# Patient Record
Sex: Male | Born: 1955
Health system: Southern US, Community
[De-identification: ages and names within clinical notes are randomized; demographics above are authoritative.]

## PROBLEM LIST (undated history)

## (undated) DIAGNOSIS — K5732 Diverticulitis of large intestine without perforation or abscess without bleeding: Secondary | ICD-10-CM

## (undated) DIAGNOSIS — Z933 Colostomy status: Secondary | ICD-10-CM

## (undated) DIAGNOSIS — M25519 Pain in unspecified shoulder: Secondary | ICD-10-CM

## (undated) DIAGNOSIS — K631 Perforation of intestine (nontraumatic): Secondary | ICD-10-CM

## (undated) DIAGNOSIS — N400 Enlarged prostate without lower urinary tract symptoms: Secondary | ICD-10-CM

## (undated) HISTORY — DX: Benign prostatic hyperplasia without lower urinary tract symptoms: N40.0

## (undated) HISTORY — DX: Pain in unspecified shoulder: M25.519

---

## 2006-01-03 ENCOUNTER — Ambulatory Visit: Payer: Self-pay | Admitting: Gastroenterology

## 2012-03-07 ENCOUNTER — Emergency Department: Payer: Self-pay | Admitting: Unknown Physician Specialty

## 2012-03-07 DIAGNOSIS — Z8781 Personal history of (healed) traumatic fracture: Secondary | ICD-10-CM | POA: Insufficient documentation

## 2012-03-07 HISTORY — PX: WRIST SURGERY: SHX841

## 2012-03-08 DIAGNOSIS — S52501A Unspecified fracture of the lower end of right radius, initial encounter for closed fracture: Secondary | ICD-10-CM | POA: Insufficient documentation

## 2012-04-27 ENCOUNTER — Ambulatory Visit: Payer: Self-pay | Admitting: Orthopedic Surgery

## 2012-04-27 LAB — CBC
HCT: 39.8 % — ABNORMAL LOW (ref 40.0–52.0)
HGB: 13.7 g/dL (ref 13.0–18.0)
MCH: 29.7 pg (ref 26.0–34.0)
MCHC: 34.5 g/dL (ref 32.0–36.0)
MCV: 86 fL (ref 80–100)
Platelet: 250 10*3/uL (ref 150–440)
RDW: 14.3 % (ref 11.5–14.5)

## 2012-04-27 LAB — BASIC METABOLIC PANEL
BUN: 18 mg/dL (ref 7–18)
Chloride: 104 mmol/L (ref 98–107)
Creatinine: 0.82 mg/dL (ref 0.60–1.30)
EGFR (Non-African Amer.): >60
Glucose: 84 mg/dL (ref 65–99)
Osmolality: 277 (ref 275–301)

## 2012-04-27 LAB — APTT: Activated PTT: 29.7 secs (ref 23.6–35.9)

## 2012-05-05 ENCOUNTER — Ambulatory Visit: Payer: Self-pay | Admitting: Orthopedic Surgery

## 2013-04-30 DIAGNOSIS — Z969 Presence of functional implant, unspecified: Secondary | ICD-10-CM | POA: Insufficient documentation

## 2014-04-17 IMAGING — CR DG SHOULDER 3+V*R*
1 series · 4 of 4 positions shown · non-contrast
Comparison: none

REASON FOR EXAM: pain s/p fall off ladder
COMMENTS:

PROCEDURE:     DXR - DXR SHOULDER RIGHT COMPLETE  - March 07, 2012  [DATE]
RESULT:     Right shoulder images demonstrate no definite fracture,
dislocation or radiopaque foreign body.

[Series 1: w shoulder external right · 0.14mm/px · 4 of 4 slices shown]
[im 1/4]
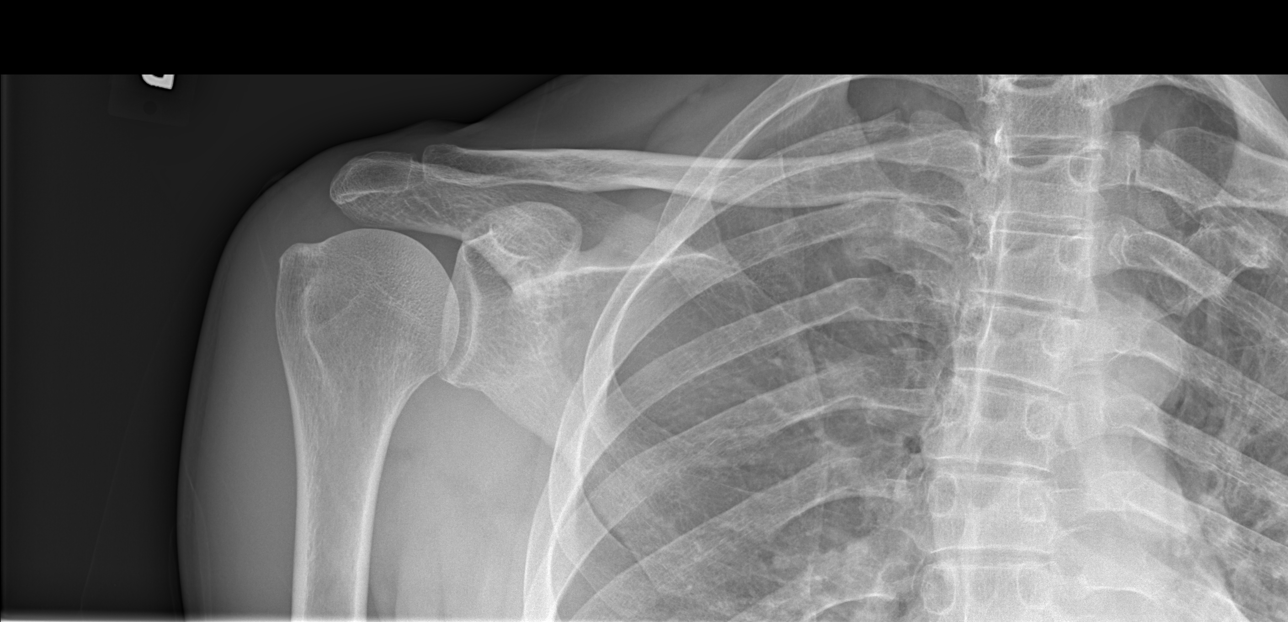
[im 2/4]
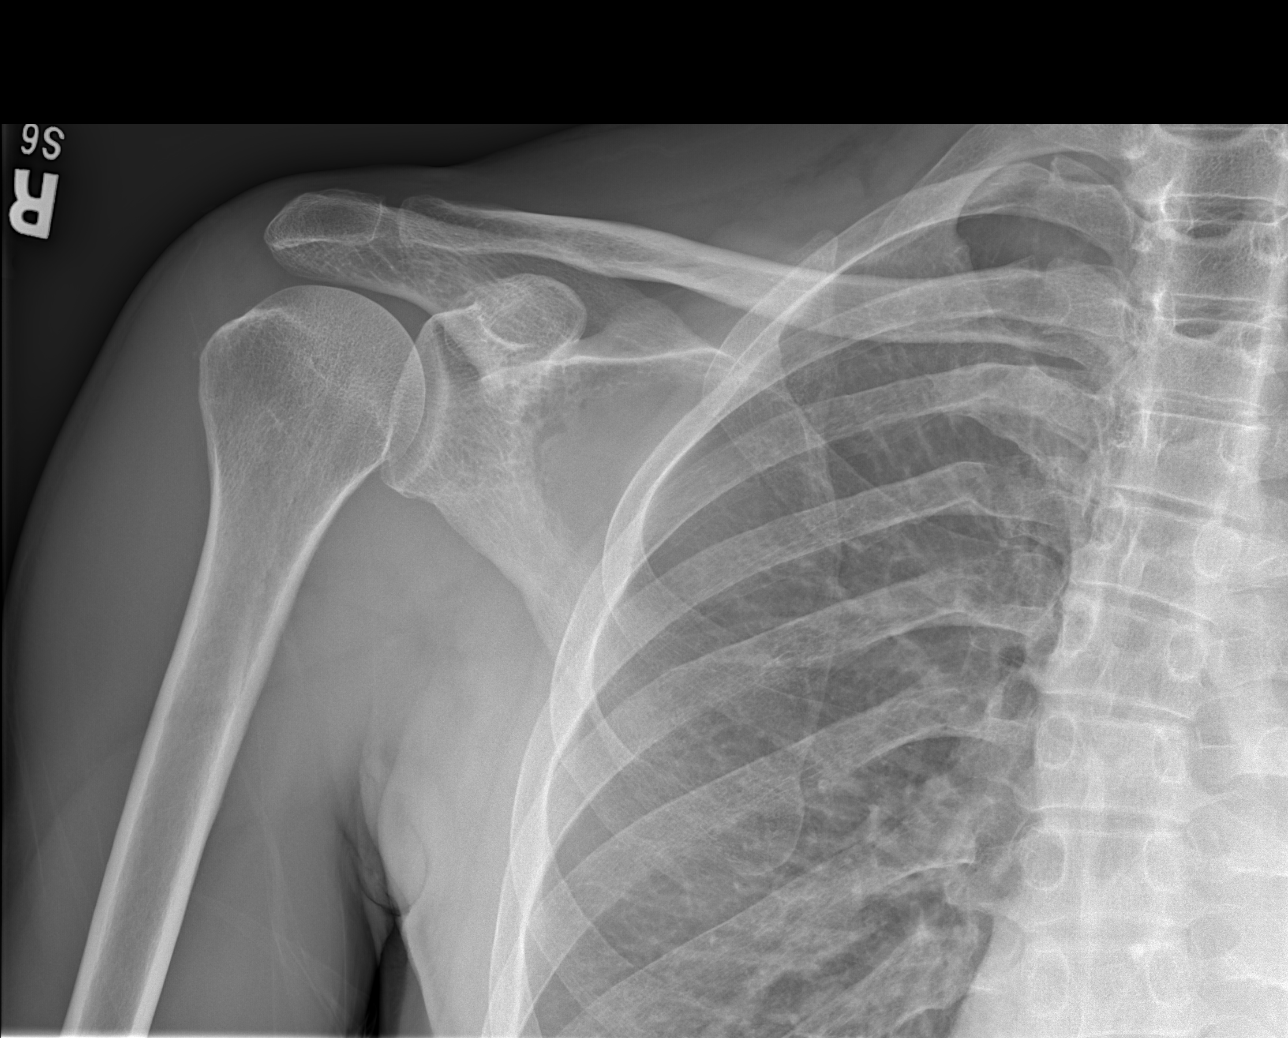
[im 3/4]
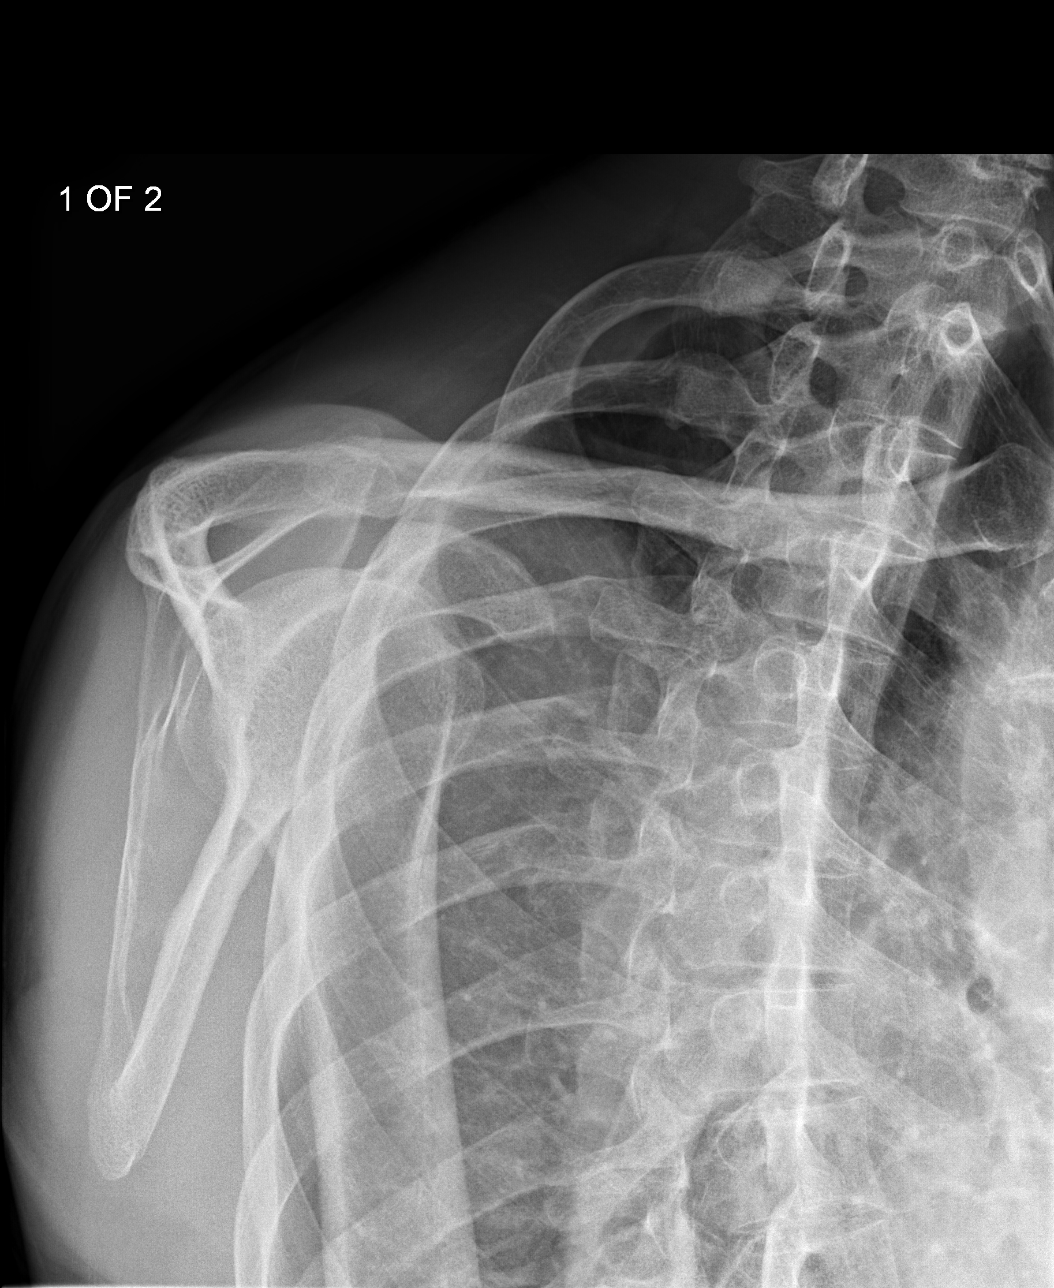
[im 4/4]
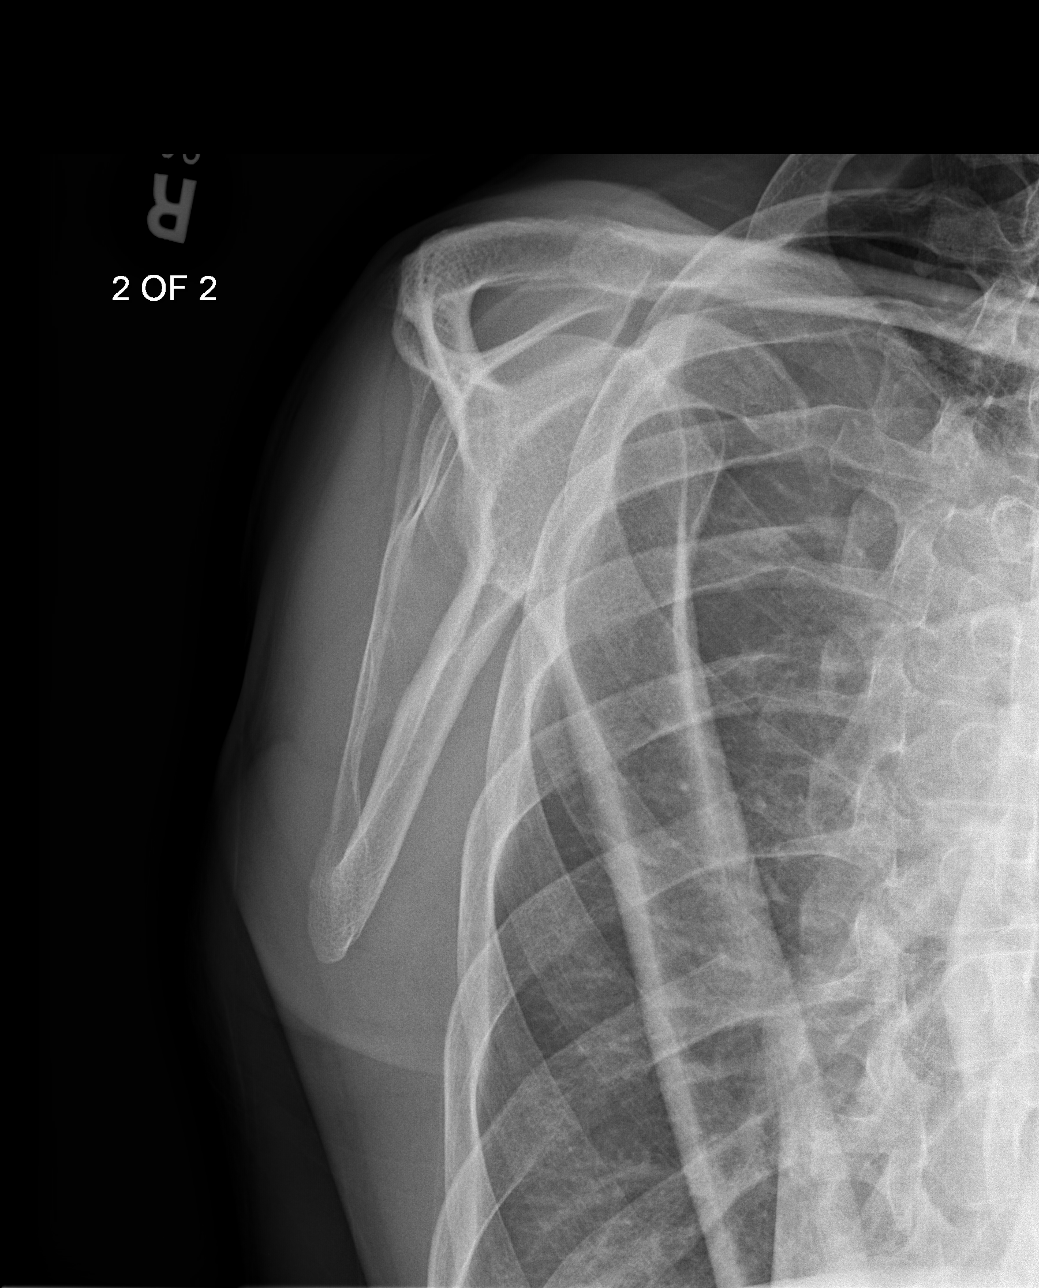

[4 of 4 positions shown; findings below may reference images not displayed]

IMPRESSION: Please see above.

[REDACTED]

## 2014-04-17 IMAGING — CT CT OF THE RIGHT WRIST WITHOUT CONTRAST
1 series · 12 of 14 positions shown, 15 images · non-contrast
Comparison: none

REASON FOR EXAM: evaluate fracture
COMMENTS:

[Series 2: bone windows · axial · 0.40mm/px · z∈[+82,+184]mm · 12 of 61 slices shown, 15 images]
[im 5/61  soft-tissue]
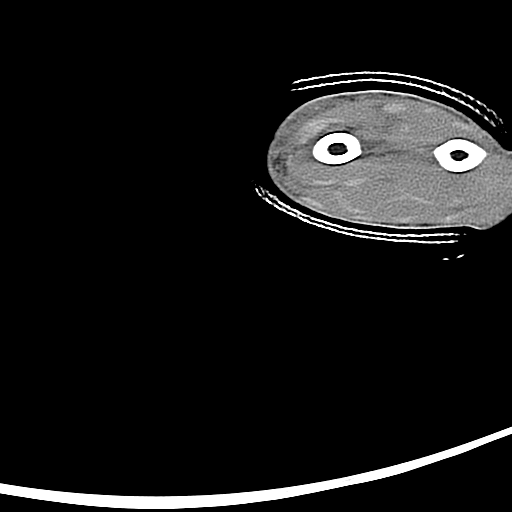
[im 5/61  bone]
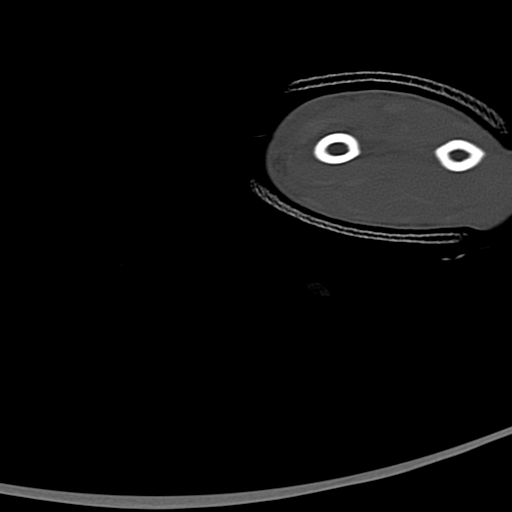
[im 10/61  bone]
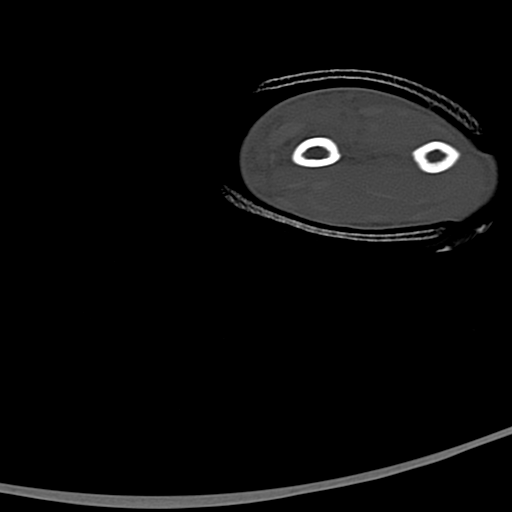
[im 14/61  bone]
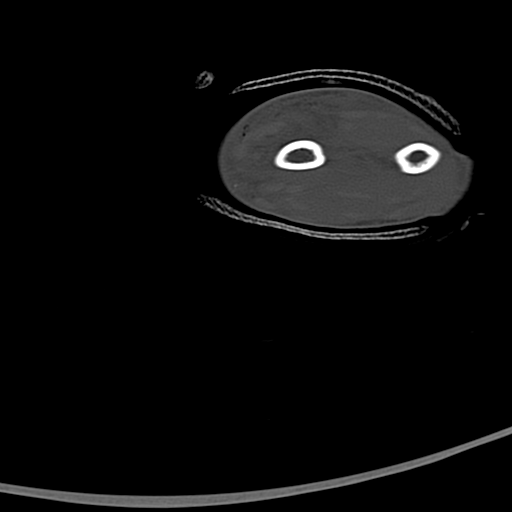
[im 19/61  bone]
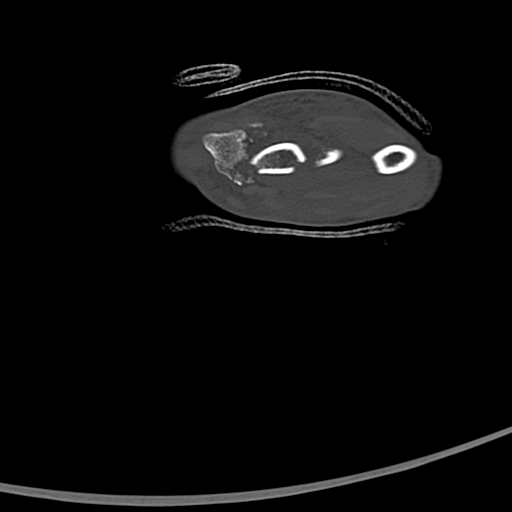
[im 24/61  soft-tissue]
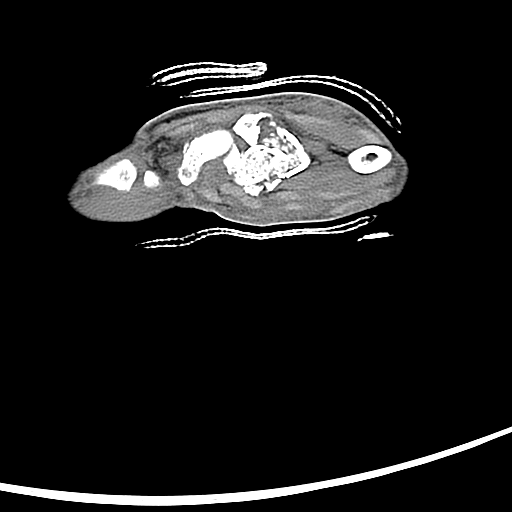
[im 24/61  bone]
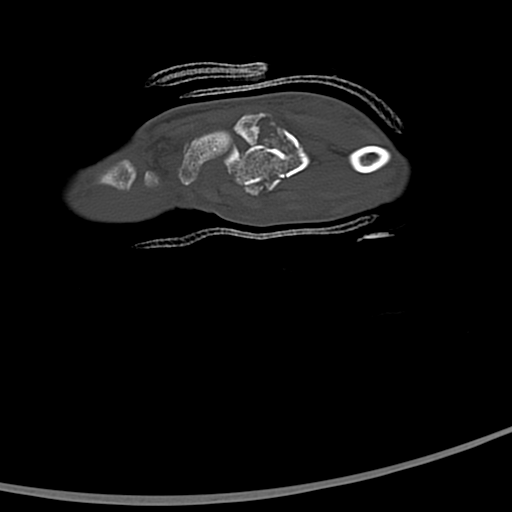
[im 28/61  bone]
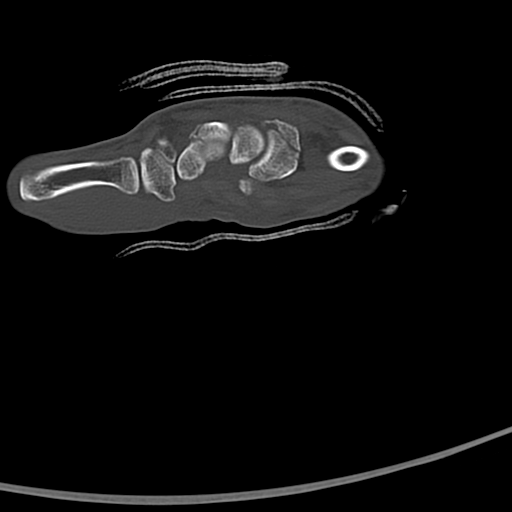
[im 33/61  bone]
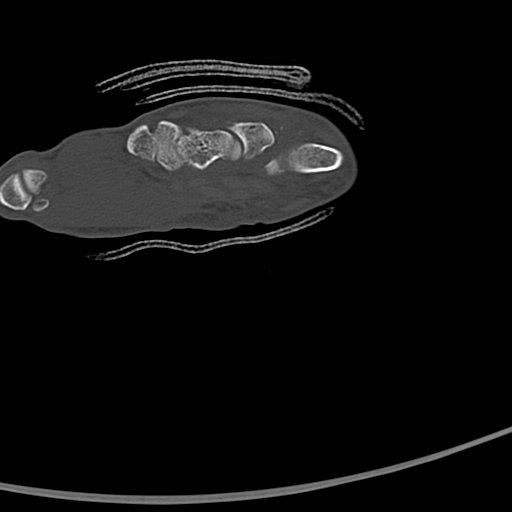
[im 37/61  bone]
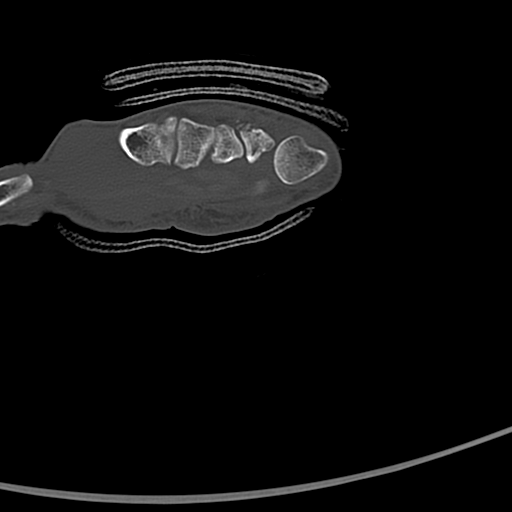
[im 42/61  soft-tissue]
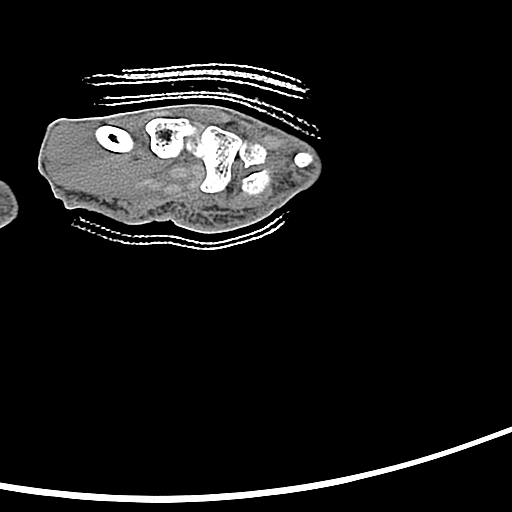
[im 42/61  bone]
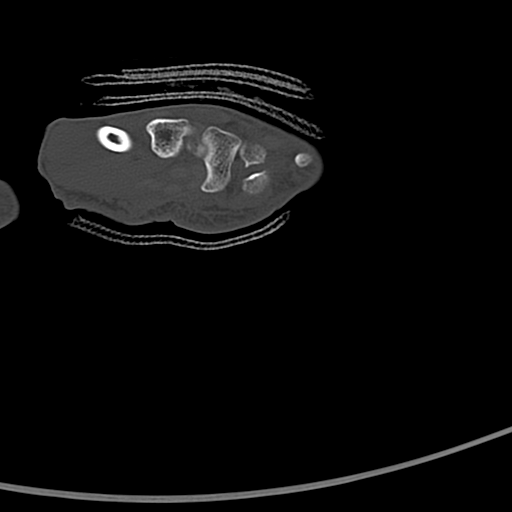
[im 47/61  bone]
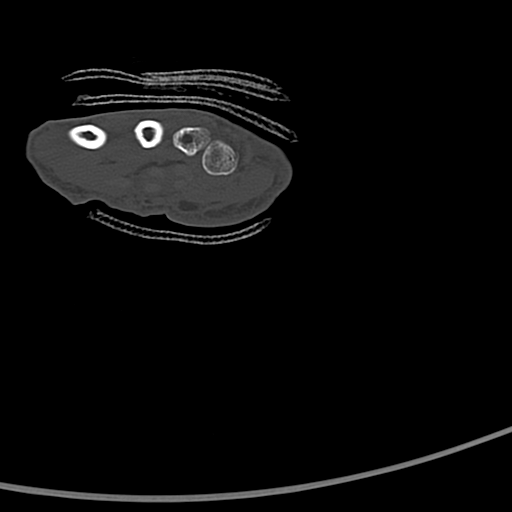
[im 51/61  bone]
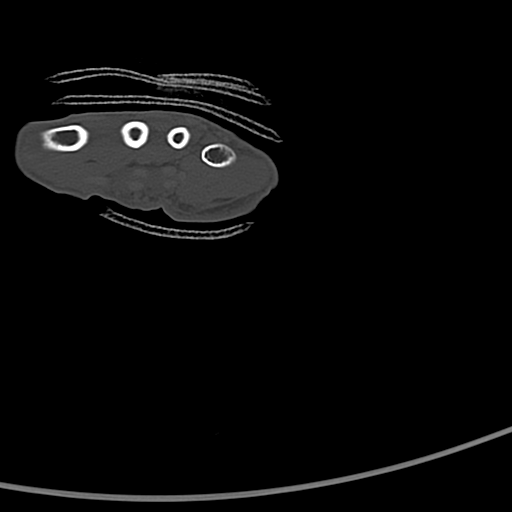
[im 56/61  bone]
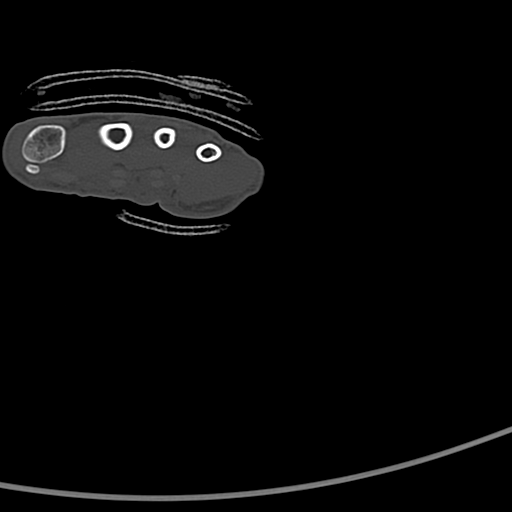

[12 of 14 positions shown; findings below may reference images not displayed]

PROCEDURE:     CT  - CT WRIST RIGHT WITHOUT CONTRAST  - March 07, 2012  [DATE]

RESULT:     Axial imaging was performed through the right wrist using a bone
algorithm with coronal and sagittal reconstructions. Images were also
reviewed on the VIA monitor. Plain films of the same day are reviewed as
well.

The patient has sustained a comminuted impacted intra-articular fracture o
the distal radius. The fracture fragments are displaced laterally around the
metaphysis of the distal radius such that the end of the metaphysis is at
the level of the former radiocarpal joint. The carpal bones appear
reasonably well maintained in position but there is cortical irregularity
consistent with a fracture of the distal pole of the scaphoid dorsally. The
lunate, triquetrium, and pisiform appear intact and normally positioned. The
distal carpal row appears intact. The metacarpal bases also are intact. The
adjacent ulna is intact.
IMPRESSION: 1. The patient has sustained a comminuted impacted displaced angulated
fracture of the distal radial metaphysis as described. The adjacent ulna is
intact.
2. There is cortical irregularity of the distal aspect of the scaphoid
dorsally which likely reflects an avulsion fracture. The relationship of the
carpal bones to one another and to the metacarpals is preserved.

[REDACTED]

## 2014-09-10 NOTE — Consult Note (Signed)
Brief Consult Note: Diagnosis: Right comminuted fracture dislocation of the distal radius.   Patient was seen by consultant.   Consult note dictated.   Recommend further assessment or treatment.   Discussed with Attending MD.   Comments: Discussed case with Dr. Enedina FinnerGoli from ER.  Patient is a 59 y/o male who sustained a closed comminuted distal radius fracture at work after falling off a ladder from approximately a 6 foot height.  A closed reduction was performed under a hematoma block in the ER and a sugar tong splint was applied.  Patient is being referred to Dr. Mercy [facility]idingMarc Richard at [facility].  I have personally spoken with Dr. Gerlene Burdockichard regarding this patient and Walter Tyler will report to his office at 8:30 tomorrow AM for further evaluation and plan for definitive surgical management.  The patient and his wife as well as the nurse from [employer] employee health understood and agreed with this plan.  Electronic Signatures: Walter Tyler, Walter Tyler (MD)  (Signed 15-Oct-13 22:28)  Authored: Brief Consult Note   Last Updated: 15-Oct-13 22:28 by Walter Tyler, Walter Tyler (MD)

## 2014-09-10 NOTE — Consult Note (Signed)
PATIENT NAME:  Walter Tyler, Walter Tyler MR#:  [id number] DATE OF BIRTH:  1956-03-07  DATE OF CONSULTATION:  03/07/2012  REFERRING PHYSICIAN:  Dr. Enedina FinnerGoli from the Emergency Department  CONSULTING PHYSICIAN:  Kathreen DevoidKevin L. Zykira Matlack, MD  REASON FOR CONSULTATION: Right closed comminuted fracture-dislocation of the right distal radius.   HISTORY OF PRESENT ILLNESS: Walter Tyler is a 59 year old male who works for [employer]. Patient states he fell off a ladder while at work from approximately 6 foot height. Patient injured his right wrist and shoulder in the process of the fall. He explains that he has pain in the posterior aspect of his right shoulder. He initially was able to abduct and forward elevate but at the time of consultation is having difficulty doing this. Patient states he has moderate pain in the right wrist. He states that he has some mild paresthesias in the thumb, index and middle fingers. Patient denies head injury or loss of consciousness.   I reviewed the patient's medical history. Patient is not on any home medications. He has no known drug allergies. He does not smoke. He lives with his wife at home. His wife is with him in the Emergency Room today.   PHYSICAL EXAMINATION:  RIGHT SHOULDER: Patient's skin is intact. There is no erythema, ecchymosis, or swelling. He has no AC joint tenderness or step off. He has point tenderness over the posterior shoulder over the glenohumeral joint as well as over the greater tuberosity and the intertuberous groove. Patient can abduct only approximately 30 degrees and forward elevate approximately 30 degrees. He has intact sensation to light touch over the right shoulder including the lateral upper arm in the axillary nerve distribution. There is no obvious deformity or asymmetry between his upper extremities.   RIGHT WRIST: Patient's skin is intact. He has significant swelling of the distal radius with an S-shaped deformity to the distal radius. His fingers are  well perfused and he has intact sensation to light touch in all five digits but has slightly decreased sensation over the thumb, index and middle finger. Patient has a palpable radial pulse. He can flex and extend all five digits. Patient has soft and compressible forearm compartments. He has no tenderness at the elbow. Patient's wrist range of motion and forearm pronation and supination are limited secondary to pain and swelling.   LABORATORY, DIAGNOSTIC AND RADIOLOGICAL DATA: I reviewed the wrist and forearm radiographs today which demonstrate a severely comminuted distal radius fracture. There is complete displacement dorsally. The carpus has followed the distal fragment dorsally. The ulna does not appear to be fractured and has maintained its normal height. There is loss of at least 10 to 15 mm of radial height. Patient has significant dorsal angulation at the fracture site.   Radiographs of the right shoulder were also reviewed including internal/external rotation, AP view, scapular Y view and axillary lateral view. This demonstrates glenohumeral joint is located. There is no evidence of fracture of the proximal humerus or glenoid. There is no fracture of the clavicle and the [facility] joint is well aligned. There is no evidence of scapular fracture.   ASSESSMENT:  1. Closed right comminuted fracture-dislocation of the distal radius.  2. Right shoulder pain with significant limitation of motion concerning for possible rotator cuff tear.   PLAN: I recommend to Walter Tyler that we close reduce his fracture given its significant deformity and possible compression of the median nerve leading to slight paresthesias in the right hand. I performed a hematoma block at the  bedside in the Emergency Department. Patient was then closed reduced with longitudinal traction and a volarly directed force. A sugar tong splint was applied. He was sent for postreduction x-rays which showed near neutral alignment on the lateral  view and the AP view showed restoration of most of the radial height but severe comminution and intra-articular extension to the fracture. The radius is a comminuted and it appears that it is a bicondylar injury.   Patient was sent for a CT scan in his splint to gain further understanding of the degree of comminution and intra-articular involvement. Patient was given a right arm sling. I contacted Dr. Mercy Riding from [facility]. He is a hand specialist and has agreed to take care of this patient who will require surgery. Dr. Gerlene Burdock has agreed to see the patient tomorrow morning at 8:30 in his office in [place] at [address] third floor. I gave this information to the patient who has agreed to see Dr. Gerlene Burdock. The employee health nurse from [employer] was with the patient in the Emergency Room along with his wife and brother. The patient was instructed to get copies of his CT scan and plain films from the Emergency Room before he left today. He was instructed to strictly elevate his right upper extremity when he goes home. Dr. Gerlene Burdock perform definitive surgical management for his right wrist fracture. I, however, will see him back in the office for his right shoulder injury. Patient will avoid any lifting or carrying with the right arm until he sees me back in the office. If the patient is not having any improvement in his strength and range of motion I will order MRI to further evaluate the integrity of the rotator cuff. I explained this plan to the patient's family and they understood and agreed. I also explained this plan to Dr. Enedina Finner from the Emergency Room. Patient was instructed to return to the ER or contact me through the hospital operator tonight if he develops severe pain, numbness, paralysis pallor to the fingers. If he encounters these symptoms he is to unwrap his sugar tong splint and go directly to the Emergency Room where I can be paged to come  further evaluate him.   ____________________________ Kathreen Devoid, MD klk:cms D: 03/07/2012 22:42:31 ET T: 03/08/2012 08:39:06 ET JOB#: [id number]  cc: Kathreen Devoid, MD, <Dictator> Kathreen Devoid MD ELECTRONICALLY SIGNED 03/09/2012 12:47

## 2014-10-26 ENCOUNTER — Other Ambulatory Visit: Payer: Self-pay | Admitting: Family Medicine

## 2015-11-10 ENCOUNTER — Other Ambulatory Visit: Payer: Self-pay | Admitting: Family Medicine

## 2015-12-15 ENCOUNTER — Ambulatory Visit (INDEPENDENT_AMBULATORY_CARE_PROVIDER_SITE_OTHER): Payer: BLUE CROSS/BLUE SHIELD | Admitting: Family Medicine

## 2015-12-15 ENCOUNTER — Encounter: Payer: Self-pay | Admitting: Family Medicine

## 2015-12-15 VITALS — BP 124/78 | HR 78 | Temp 98.7°F | Resp 18 | Ht 71.0 in | Wt 160.1 lb

## 2015-12-15 DIAGNOSIS — Z1211 Encounter for screening for malignant neoplasm of colon: Secondary | ICD-10-CM | POA: Diagnosis not present

## 2015-12-15 DIAGNOSIS — Z1159 Encounter for screening for other viral diseases: Secondary | ICD-10-CM | POA: Diagnosis not present

## 2015-12-15 DIAGNOSIS — Z0000 Encounter for general adult medical examination without abnormal findings: Secondary | ICD-10-CM | POA: Diagnosis not present

## 2015-12-15 DIAGNOSIS — Z79899 Other long term (current) drug therapy: Secondary | ICD-10-CM

## 2015-12-15 DIAGNOSIS — M25569 Pain in unspecified knee: Secondary | ICD-10-CM | POA: Insufficient documentation

## 2015-12-15 DIAGNOSIS — Z131 Encounter for screening for diabetes mellitus: Secondary | ICD-10-CM

## 2015-12-15 DIAGNOSIS — Z23 Encounter for immunization: Secondary | ICD-10-CM | POA: Diagnosis not present

## 2015-12-15 MED ORDER — MELOXICAM 15 MG PO TABS: 15.0000 mg | ORAL_TABLET | Freq: Every day | ORAL | 2 refills | Status: DC

## 2015-12-15 NOTE — Progress Notes (Signed)
Name: Walter Tyler   MRN: [id number]    DOB: 1955-12-24   Date:12/15/2015       Progress Note  Subjective  Chief Complaint  Chief Complaint  Patient presents with  . Annual Exam    HPI  Well male exam: doing well, he has urinary frequency when he has a lot of caffeine.   IPSS Questionnaire (AUA-7): Over the past month.   1)  How often have you had a sensation of not emptying your bladder completely after you finish urinating?  0 - Not at all  2)  How often have you had to urinate again less than two hours after you finished urinating? 3 - About half the time  3)  How often have you found you stopped and started again several times when you urinated?  0 - Not at all  4) How difficult have you found it to postpone urination?  0 - Not at all  5) How often have you had a weak urinary stream?  0 - Not at all  6) How often have you had to push or strain to begin urination?  0 - Not at all  7) How many times did you most typically get up to urinate from the time you went to bed until the time you got up in the morning?  1 - 1 time  Total score:  0-7 mildly symptomatic   8-19 moderately symptomatic   20-35 severely symptomatic     Patient Active Problem List   Diagnosis Date Noted  . Intermittent knee pain 12/15/2015  . History of fracture due to fall 03/07/2012    Past Surgical History:  Procedure Laterality Date  . WRIST SURGERY Right 03/07/2012    Family History  Problem Relation Age of Onset  . Diabetes Mother   . Heart disease Mother   . Diabetes Father     Social History   Social History  . Marital status: Married    Spouse name: N/A  . Number of children: N/A  . Years of education: N/A   Occupational History  . Not on file.   Social History Main Topics  . Smoking status: Never Smoker  . Smokeless tobacco: Not on file  . Alcohol use No  . Drug use: No  . Sexual activity: Yes   Other Topics Concern  . Not on file   Social History Narrative  . No  narrative on file     Current Outpatient Prescriptions:  .  aspirin 81 MG tablet, Take 81 mg by mouth daily., Disp: , Rfl:  .  meloxicam (MOBIC) 15 MG tablet, Take 1 tablet (15 mg total) by mouth daily., Disp: 30 tablet, Rfl: 2  No Known Allergies   ROS  Constitutional: Negative for fever or weight change.  Respiratory: Negative for cough and shortness of breath.   Cardiovascular: Negative for chest pain or palpitations.  Gastrointestinal: Negative for abdominal pain, no bowel changes.  Musculoskeletal: Negative for gait problem or joint swelling.  Skin: Negative for rash.  Neurological: Negative for dizziness or headache.  No other specific complaints in a complete review of systems (except as listed in HPI above).  Objective  Vitals:   12/15/15 1408  BP: 124/78  Pulse: 78  Resp: 18  Temp: 98.7 F (37.1 C)  SpO2: 98%  Weight: 160 lb 2 oz (72.6 kg)  Height: 5\' 11"  (1.803 m)    Body mass index is 22.33 kg/m.  Physical Exam  Constitutional: Patient appears well-developed  and well-nourished. No distress.  HENT: Head: Normocephalic and atraumatic. Ears: B TMs ok, no erythema or effusion; Nose: Nose normal. Mouth/Throat: Oropharynx is clear and moist. No oropharyngeal exudate.  Eyes: Conjunctivae and EOM are normal. Pupils are equal, round, and reactive to light. No scleral icterus.  Neck: Normal range of motion. Neck supple. No JVD present. No thyromegaly present.  Cardiovascular: Normal rate, regular rhythm and normal heart sounds.  No murmur heard. No BLE edema. Pulmonary/Chest: Effort normal and breath sounds normal. No respiratory distress. Abdominal: Soft. Bowel sounds are normal, no distension. There is no tenderness. no masses MALE GENITALIA: Normal descended testes bilaterally, no masses palpated, no hernias, no lesions, no discharge RECTAL: Prostate normal size and consistency, no rectal masses or hemorrhoids Musculoskeletal: Normal range of motion, no joint  effusions. No gross deformities Neurological: he is alert and oriented to person, place, and time. No cranial nerve deficit. Coordination, balance, strength, speech and gait are normal.  Skin: Skin is warm and dry. No rash noted. No erythema.  Psychiatric: Patient has a normal mood and affect. behavior is normal. Judgment and thought content normal.  PHQ2/9: Depression screen PHQ 2/9 12/15/2015  Decreased Interest 0  Down, Depressed, Hopeless 0  PHQ - 2 Score 0    Fall Risk: Fall Risk  12/15/2015  Falls in the past year? No     Assessment & Plan  1. Encounter for routine history and physical exam for male  - Lipid panel - COMPLETE METABOLIC PANEL WITH GFR - Hemoglobin A1c  2. Intermittent knee pain, unspecified laterality  He is a runner, and takes it prn for knee pain    3. Need for diphtheria-tetanus-pertussis (Tdap) vaccine  - Tdap vaccine greater than or equal to 7yo IM  4. Need for shingles vaccine  - Varicella-zoster vaccine subcutaneous  5. Need for hepatitis C screening test  - Hepatitis C antibody  6. Colon cancer screening  - Ambulatory referral to Gastroenterology  7. Screening for diabetes mellitus  -hgbA1C  8. Encounter for long-term (current) use of medications  - COMPLETE METABOLIC PANEL WITH GFR

## 2015-12-16 LAB — COMPLETE METABOLIC PANEL WITH GFR
ALT: 32 U/L (ref 9–46)
AST: 46 U/L — ABNORMAL HIGH (ref 10–35)
Albumin: 4.3 g/dL (ref 3.6–5.1)
Alkaline Phosphatase: 43 U/L (ref 40–115)
BILIRUBIN TOTAL: 0.4 mg/dL (ref 0.2–1.2)
BUN: 17 mg/dL (ref 7–25)
CHLORIDE: 105 mmol/L (ref 98–110)
CO2: 26 mmol/L (ref 20–31)
Calcium: 9.1 mg/dL (ref 8.6–10.3)
Creat: 1.2 mg/dL (ref 0.70–1.25)
GFR, EST NON AFRICAN AMERICAN: 65 mL/min (ref >=60)
GFR, Est African American: 76 mL/min (ref >=60)
GLUCOSE: 92 mg/dL (ref 65–99)
POTASSIUM: 4.1 mmol/L (ref 3.5–5.3)
SODIUM: 140 mmol/L (ref 135–146)
TOTAL PROTEIN: 7.3 g/dL (ref 6.1–8.1)

## 2015-12-16 LAB — LIPID PANEL
Cholesterol: 195 mg/dL (ref 125–200)
HDL: 68 mg/dL (ref >=40)
LDL CALC: 117 mg/dL (ref <130)
Total CHOL/HDL Ratio: 2.9 Ratio (ref <=5.0)
Triglycerides: 49 mg/dL (ref <150)
VLDL: 10 mg/dL (ref <30)

## 2015-12-17 LAB — HEMOGLOBIN A1C
Hgb A1c MFr Bld: 5.5 % (ref <5.7)
Mean Plasma Glucose: 111 mg/dL

## 2015-12-17 LAB — HEPATITIS C ANTIBODY: HCV AB: NEGATIVE

## 2015-12-19 ENCOUNTER — Telehealth: Payer: Self-pay

## 2015-12-19 NOTE — Telephone Encounter (Signed)
Left message on house phone for patient to give [place] a call regarding labs.

## 2015-12-19 NOTE — Telephone Encounter (Signed)
-----   Message from Alba Cory, MD sent at 12/18/2015  2:06 PM EDT ----- Hepatitis C negative hgbA1C is normal  Lipid panel is at goal Sugar, kidney  are within normal limits One of the liver enzymes is slightly bumped, not concerning, we will monitor it

## 2016-01-12 ENCOUNTER — Other Ambulatory Visit: Payer: Self-pay

## 2016-01-12 MED ORDER — MELOXICAM 15 MG PO TABS: 15.0000 mg | ORAL_TABLET | Freq: Every day | ORAL | 1 refills | Status: DC

## 2016-01-12 NOTE — Telephone Encounter (Addendum)
Patient requesting refill of Meloxicam with a 90 day supply of [facility].

## 2016-02-07 ENCOUNTER — Other Ambulatory Visit: Payer: Self-pay | Admitting: Family Medicine

## 2016-05-12 DIAGNOSIS — Z0100 Encounter for examination of eyes and vision without abnormal findings: Secondary | ICD-10-CM | POA: Diagnosis not present

## 2016-08-29 ENCOUNTER — Other Ambulatory Visit: Payer: Self-pay | Admitting: Family Medicine

## 2016-08-30 NOTE — Telephone Encounter (Signed)
Patient requesting refill of Meloxicam to [facility].  

## 2016-11-28 ENCOUNTER — Other Ambulatory Visit: Payer: Self-pay | Admitting: Family Medicine

## 2016-12-15 ENCOUNTER — Encounter: Payer: Self-pay | Admitting: Family Medicine

## 2016-12-15 ENCOUNTER — Ambulatory Visit (INDEPENDENT_AMBULATORY_CARE_PROVIDER_SITE_OTHER): Payer: BLUE CROSS/BLUE SHIELD | Admitting: Family Medicine

## 2016-12-15 ENCOUNTER — Encounter: Payer: BLUE CROSS/BLUE SHIELD | Admitting: Family Medicine

## 2016-12-15 VITALS — BP 110/70 | HR 50 | Temp 97.8°F | Resp 16 | Ht 71.0 in | Wt 167.4 lb

## 2016-12-15 DIAGNOSIS — Z113 Encounter for screening for infections with a predominantly sexual mode of transmission: Secondary | ICD-10-CM

## 2016-12-15 DIAGNOSIS — Z1211 Encounter for screening for malignant neoplasm of colon: Secondary | ICD-10-CM | POA: Diagnosis not present

## 2016-12-15 DIAGNOSIS — Z0000 Encounter for general adult medical examination without abnormal findings: Secondary | ICD-10-CM

## 2016-12-15 DIAGNOSIS — Z87438 Personal history of other diseases of male genital organs: Secondary | ICD-10-CM | POA: Diagnosis not present

## 2016-12-15 DIAGNOSIS — R001 Bradycardia, unspecified: Secondary | ICD-10-CM

## 2016-12-15 LAB — COMPLETE METABOLIC PANEL WITH GFR
ALT: 31 U/L (ref 9–46)
AST: 41 U/L — ABNORMAL HIGH (ref 10–35)
Albumin: 4.4 g/dL (ref 3.6–5.1)
Alkaline Phosphatase: 48 U/L (ref 40–115)
BUN: 13 mg/dL (ref 7–25)
CALCIUM: 9.4 mg/dL (ref 8.6–10.3)
CHLORIDE: 104 mmol/L (ref 98–110)
CO2: 24 mmol/L (ref 20–31)
CREATININE: 1.05 mg/dL (ref 0.70–1.25)
GFR, EST AFRICAN AMERICAN: 88 mL/min (ref >=60)
GFR, Est Non African American: 76 mL/min (ref >=60)
Glucose, Bld: 71 mg/dL (ref 65–99)
Potassium: 4.2 mmol/L (ref 3.5–5.3)
Sodium: 139 mmol/L (ref 135–146)
Total Bilirubin: 0.6 mg/dL (ref 0.2–1.2)
Total Protein: 7.4 g/dL (ref 6.1–8.1)

## 2016-12-15 LAB — LIPID PANEL
Cholesterol: 210 mg/dL — ABNORMAL HIGH (ref <200)
HDL: 60 mg/dL (ref >40)
LDL Cholesterol: 139 mg/dL — ABNORMAL HIGH (ref <100)
TRIGLYCERIDES: 57 mg/dL (ref <150)
Total CHOL/HDL Ratio: 3.5 Ratio (ref <5.0)
VLDL: 11 mg/dL (ref <30)

## 2016-12-15 NOTE — Addendum Note (Signed)
Addended by: Maurice SmallBOYCE, Roshanda Balazs E on: 12/15/2016 10:20 AM   Modules accepted: Orders

## 2016-12-15 NOTE — Progress Notes (Addendum)
Name: Walter Tyler   MRN: [id number]    DOB: 1955-09-12   Date:12/16/2016       Progress Note  Subjective  Chief Complaint  Chief Complaint  Patient presents with  . Annual Exam    HPI  PT presents for annual CPE - notes that he is still running and exercising daily, has to warm up a little more because he is a little stiff in the morning, otherwise feeling very well. Takes Meloxicam as needed.  USPSTF grade A and B recommendations  IPSS Questionnaire (AUA-7): Over the past month.   1)  How often have you had a sensation of not emptying your bladder completely after you finish urinating?  0 - Not at all  2)  How often have you had to urinate again less than two hours after you finished urinating? 4 - More than half the time - depends on situation - if running it's worse  3)  How often have you found you stopped and started again several times when you urinated?  0 - Not at all  4) How difficult have you found it to postpone urination?  0 - Not at all  5) How often have you had a weak urinary stream?  0 - Not at all  6) How often have you had to push or strain to begin urination?  0 - Not at all  7) How many times did you most typically get up to urinate from the time you went to bed until the time you got up in the morning?  1 - 1 time  Total score:  0-7 mildly symptomatic   8-19 moderately symptomatic   20-35 severely symptomatic  Score: 5   Depression:  Depression screen [facility] 2/9 12/15/2016 12/15/2015  Decreased Interest 0 0  Down, Depressed, Hopeless 0 0  PHQ - 2 Score 0 0   Hypertension: BP Readings from Last 3 Encounters:  12/15/16 110/70  12/15/15 124/78   Obesity: Wt Readings from Last 3 Encounters:  12/15/16 167 lb 6.4 oz (75.9 kg)  12/15/15 160 lb 2 oz (72.6 kg)   BMI Readings from Last 3 Encounters:  12/15/16 23.35 kg/m  12/15/15 22.33 kg/m    Alcohol: Never Tobacco use: Never HIV, hep B, hep C: Negative Hep C 12/15/2015, declines Hep B, will do HIV  today Married STD testing and prevention (chl/gon/syphilis): willing do test today. Lipids: We will check today  Lab Results  Component Value Date   CHOL 210 (H) 12/15/2016   CHOL 195 12/15/2015   Lab Results  Component Value Date   HDL 60 12/15/2016   HDL 68 12/15/2015   Lab Results  Component Value Date   LDLCALC 139 (H) 12/15/2016   LDLCALC 117 12/15/2015   Lab Results  Component Value Date   TRIG 57 12/15/2016   TRIG 49 12/15/2015   Lab Results  Component Value Date   CHOLHDL 3.5 12/15/2016   CHOLHDL 2.9 12/15/2015   No results found for: LDLDIRECT Glucose:  Glucose  Date Value Ref Range Status  04/27/2012 84 65 - 99 mg/dL Final   Glucose, Bld  Date Value Ref Range Status  12/15/2016 71 65 - 99 mg/dL Final  40/98/1191 92 65 - 99 mg/dL Final   Colorectal cancer: Cologuard, does not want to do colonoscopy this year Prostate cancer: Will check PSA today. Lab Results  Component Value Date   PSA 0.9 12/15/2016   Lung cancer: Doesn't qualify AAA: Doesn't qualify Aspirin: Not taking daily.  Diet: Well balanced - small portions, sometimes eats fried foods, but eats plenty of lean proteins, not as many fruits and vegetables Exercise: Patient is a running and is competing in the [employer] - The 400 and 800 meter runs and the standing broad jump.  He is in excellent shape and exercises daily. Skin cancer: No concerning lesions that patient has noticed, wears sunscreen when outside for long periods of time.  Advanced Care Planning: A voluntary discussion about advance care planning including the explanation and discussion of advance directives was extensively discussed with the patient. Explanation about the health care proxy and Living will was reviewed. During this discussion, the patient was able to identify a health care proxy as Gennette PacJanice Zanders and plans/does not plan to fill out the paperwork required.  Patient Active Problem List   Diagnosis Date  Noted  . Intermittent knee pain 12/15/2015  . History of fracture due to fall 03/07/2012    Past Surgical History:  Procedure Laterality Date  . WRIST SURGERY Right 03/07/2012    Family History  Problem Relation Age of Onset  . Diabetes Mother   . Heart disease Mother   . Diabetes Father     Social History   Social History  . Marital status: Married    Spouse name: N/A  . Number of children: N/A  . Years of education: N/A   Occupational History  . Not on file.   Social History Main Topics  . Smoking status: Never Smoker  . Smokeless tobacco: Never Used  . Alcohol use No  . Drug use: No  . Sexual activity: Yes   Other Topics Concern  . Not on file   Social History Narrative  . No narrative on file     Current Outpatient Prescriptions:  .  aspirin 81 MG tablet, Take 81 mg by mouth daily., Disp: , Rfl:  .  meloxicam (MOBIC) 15 MG tablet, TAKE 1 TABLET (15 MG TOTAL) BY MOUTH DAILY., Disp: 90 tablet, Rfl: 0  No Known Allergies   ROS  Constitutional: Negative for fever or weight change.  Respiratory: Negative for cough and shortness of breath.   Cardiovascular: Negative for chest pain or palpitations.  Gastrointestinal: Negative for abdominal pain, no bowel changes.  Musculoskeletal: Negative for gait problem or joint swelling.  Skin: Negative for rash.  Neurological: Negative for dizziness or headache.  No other specific complaints in a complete review of systems (except as listed in HPI above).  Objective  Vitals:   12/15/16 0939 12/15/16 1000  BP: 110/70   Pulse: (!) 42 (!) 50  Resp: 16   Temp: 97.8 F (36.6 C)   TempSrc: Oral   SpO2: 95%   Weight: 167 lb 6.4 oz (75.9 kg)   Height: 5\' 11"  (1.803 m)     Body mass index is 23.35 kg/m.  HR is 50 auscultated. Pt is extremely active and a life-long runner - considered to be athletic bradycardia.  Physical Exam Constitutional: Patient appears well-developed and well-nourished. No distress.   HENT: Head: Normocephalic and atraumatic. Ears: B TMs ok, no erythema or effusion; Nose: Nose normal. Mouth/Throat: Oropharynx is clear and moist. No oropharyngeal exudate.  Eyes: Conjunctivae and EOM are normal. Pupils are equal, round, and reactive to light. No scleral icterus.  Neck: Normal range of motion. Neck supple. No JVD present. No thyromegaly present.  Cardiovascular: Bradycardic at 50bpm auscultated, regular rhythm and normal heart sounds.  No murmur heard. No BLE edema. Pulmonary/Chest: Effort  normal and breath sounds normal. No respiratory distress. Abdominal: Soft. Bowel sounds are normal, no distension. There is no tenderness. no masses MALE GENITALIA: Normal descended testes bilaterally, no masses palpated, no hernias, no lesions, no discharge RECTAL: Deferred Musculoskeletal: Normal range of motion, no joint effusions. No gross deformities Neurological: he is alert and oriented to person, place, and time. No cranial nerve deficit. Coordination, balance, strength, speech and gait are normal.  Skin: Skin is warm and dry. No rash noted. No erythema.  Psychiatric: Patient has a normal mood and affect. behavior is normal. Judgment and thought content normal.  Recent Results (from the past 2160 hour(s))  GC/Chlamydia Probe Amp     Status: None   Collection Time: 12/15/16 10:20 AM  Result Value Ref Range   CT Probe RNA NOT DETECTED     Comment:                    **Normal Reference Range: NOT DETECTED**   This test was performed using the APTIMA COMBO2 Assay ([employer]   The analytical performance characteristics of this assay, when used to test SurePath specimens have been determined by [facility]      GC Probe RNA NOT DETECTED     Comment:                    **Normal Reference Range: NOT DETECTED**   This test was performed using the APTIMA COMBO2 Assay ([employer]   The analytical performance characteristics of this assay, when used to test  SurePath specimens have been determined by [facility]     PSA     Status: None   Collection Time: 12/15/16 12:20 PM  Result Value Ref Range   PSA 0.9 <=4.0 ng/mL    Comment:   The total PSA value from this assay system is standardized against the WHO standard. The test result will be approximately 20% lower when compared to the equimolar-standardized total PSA ([employer]). Comparison of serial PSA results should be interpreted with this fact in mind.   This test was performed using the Siemens chemiluminescent method. Values obtained from different assay methods cannot be used interchangeably. PSA levels, regardless of value, should not be interpreted as absolute evidence of the presence or absence of disease.     HIV antibody     Status: None (Preliminary result)   Collection Time: 12/15/16 12:20 PM  Result Value Ref Range   HIV 1&2 Ab, 4th Generation  NONREACTIVE    Comment:   PLEASE NOTE: This information has been disclosed to you from records whose confidentiality may be protected by state law. If your state requires such protection, then the state law prohibits you from making any further disclosure of the information without the specific written consent of the person to whom it pertains, or as otherwise permitted by law. A general authorization for the release of medical or other information is NOT sufficient for this purpose.   The performance of this assay has not been clinically validated in patients less than 85 years old.   For additional information please refer to http://education.[facility].com/faq/FAQ106.  (This link is being provided for informational/educational purposes only.)     RPR     Status: None   Collection Time: 12/15/16 12:20 PM  Result Value Ref Range   RPR Ser Ql NON REAC NON REAC  COMPLETE METABOLIC PANEL WITH GFR     Status: Abnormal   Collection Time: 12/15/16 12:20 PM  Result Value Ref Range   Sodium 139 135 - 146  mmol/L   Potassium 4.2 3.5 - 5.3 mmol/L   Chloride 104 98 - 110 mmol/L   CO2 24 20 - 31 mmol/L   Glucose, Bld 71 65 - 99 mg/dL   BUN 13 7 - 25 mg/dL   Creat 1.611.05 0.960.70 - 0.451.25 mg/dL    Comment:   For patients > or = 61 years of age: The upper reference limit for Creatinine is approximately 13% higher for people identified as African-American.      Total Bilirubin 0.6 0.2 - 1.2 mg/dL   Alkaline Phosphatase 48 40 - 115 U/L   AST 41 (H) 10 - 35 U/L   ALT 31 9 - 46 U/L   Total Protein 7.4 6.1 - 8.1 g/dL   Albumin 4.4 3.6 - 5.1 g/dL   Calcium 9.4 8.6 - 40.910.3 mg/dL   GFR, Est African American 88 >=60 mL/min   GFR, Est Non African American 76 >=60 mL/min  Lipid panel     Status: Abnormal   Collection Time: 12/15/16 12:20 PM  Result Value Ref Range   Cholesterol 210 (H) <200 mg/dL   Triglycerides 57 <811<150 mg/dL   HDL 60 >91>40 mg/dL   Total CHOL/HDL Ratio 3.5 <5.0 Ratio   VLDL 11 <30 mg/dL   LDL Cholesterol 478139 (H) <100 mg/dL    Fall Risk: Fall Risk  12/15/2015  Falls in the past year? No   Assessment & Plan 1. Encounter for annual physical exam - COMPLETE METABOLIC PANEL WITH GFR - Lipid panel  2. Screening for colon cancer - Cologuard  3. History of BPH - PSA  4. Screening examination for STD (sexually transmitted disease) - HIV antibody - RPR  5. Bradycardia with 41-50 beats per minute - Likely related to athleticism, he is asymptomatic   -Prostate cancer screening and PSA options (with potential risks and benefits of testing vs not testing) were discussed along with recent recs/guidelines. -USPSTF grade A and B recommendations reviewed with patient; age-appropriate recommendations, preventive care, screening tests, etc discussed and encouraged; healthy living encouraged; see AVS for patient education given to patient -Discussed importance of 150 minutes of physical activity weekly, eat two servings of fish weekly, eat one serving of tree nuts ( cashews, pistachios,  pecans, almonds.Marland Kitchen.) every other day, eat 6 servings of fruit/vegetables daily and drink plenty of water and avoid sweet beverages.  -Reviewed Health Maintenance: Cologuard ordered.  I have reviewed this encounter including the documentation in this note and/or discussed this patient with the Deboraha Sprangprovider,Soniyah Mcglory, FNP, NP-C. I am certifying that I agree with the content of this note as supervising physician.  Alba CoryKrichna Sowles, MD [facility] 12/23/2016, 8:17 AM

## 2016-12-15 NOTE — Patient Instructions (Addendum)

## 2016-12-16 ENCOUNTER — Encounter: Payer: Self-pay | Admitting: Family Medicine

## 2016-12-16 LAB — RPR

## 2016-12-16 LAB — GC/CHLAMYDIA PROBE AMP
CT Probe RNA: NOT DETECTED
GC Probe RNA: NOT DETECTED

## 2016-12-16 LAB — HIV ANTIBODY (ROUTINE TESTING W REFLEX): HIV: NONREACTIVE

## 2016-12-16 LAB — PSA: PSA: 0.9 ng/mL (ref <=4.0)

## 2017-01-17 DIAGNOSIS — Z1212 Encounter for screening for malignant neoplasm of rectum: Secondary | ICD-10-CM | POA: Diagnosis not present

## 2017-01-17 DIAGNOSIS — Z1211 Encounter for screening for malignant neoplasm of colon: Secondary | ICD-10-CM | POA: Diagnosis not present

## 2017-01-22 LAB — COLOGUARD: COLOGUARD: NEGATIVE

## 2017-02-24 ENCOUNTER — Other Ambulatory Visit: Payer: Self-pay | Admitting: Family Medicine

## 2017-03-11 DIAGNOSIS — M1711 Unilateral primary osteoarthritis, right knee: Secondary | ICD-10-CM | POA: Diagnosis not present

## 2017-05-18 DIAGNOSIS — H5213 Myopia, bilateral: Secondary | ICD-10-CM | POA: Diagnosis not present

## 2017-05-26 ENCOUNTER — Other Ambulatory Visit: Payer: Self-pay | Admitting: Family Medicine

## 2017-05-26 NOTE — Telephone Encounter (Signed)
Copied from CRM [id number]. Topic: Quick Communication - See Telephone Encounter >> May 26, 2017 10:41 AM Floria RavelingStovall, Shana A wrote: CRM for notification. See Telephone encounter for: pt called in and needs a refill on his  meloxicam (MOBIC) 15 MG tablet [[id number]][[id number]]  Pharmacy - [facility] on [address]   05/26/17.

## 2017-06-03 ENCOUNTER — Telehealth: Payer: Self-pay | Admitting: Family Medicine

## 2017-06-03 NOTE — Telephone Encounter (Signed)
It looks like Dr. Carlynn PurlSowles prescribes this. I will forward to her.

## 2017-06-03 NOTE — Telephone Encounter (Signed)
Copied from CRM [id number]. Topic: Quick Communication - Rx Refill/Question >> Jun 03, 2017 11:22 AM Alexander BergeronBarksdale, Walter B wrote: Reason for CRM: pt called and wants to speak w/ Walter JacobsonHelen about a Rx of meloxicam (MOBIC) 15 MG tablet [[id number]][[id number]] to be refilled

## 2017-06-03 NOTE — Telephone Encounter (Signed)
Please advise 

## 2017-08-16 ENCOUNTER — Telehealth: Payer: Self-pay | Admitting: Family Medicine

## 2017-08-16 NOTE — Telephone Encounter (Signed)
Copied from CRM [phone]#[id number]. Topic: Medical Record Request - Patient ROI Request >> Aug 12, 2017  2:23 PM Guinevere FerrariMorris, Sharamare E, NT wrote: Patient Name/DOB/MRN #: Walter Tyler Karaffa, [id number], 04-12-1956 Requestor Name/Agency: Patient Call Back #: [phone] Information Requested: Patient called and wanted to see if he can pick up a copy of his recent labs for insurance purposes. Patient asked if he could possibly pick paperwork up today and would like a call back when information is ready for pick up.    Route to [facility] Pool for [facility]. For all other clinics, route to the clinic's PEC Pool.  >> Aug 12, 2017  4:16 PM Mock, Orlinda BlalockMiel R wrote: Called patient to get clarification as to which labs results he wanted.  I was unable to leave voice message because the mailbox was full.

## 2017-08-24 ENCOUNTER — Other Ambulatory Visit: Payer: Self-pay | Admitting: Family Medicine

## 2017-08-25 NOTE — Telephone Encounter (Signed)
Patient calling checking status, please advise. Call back [phone]

## 2017-08-25 NOTE — Addendum Note (Signed)
Addended by: Cynda FamiliaJOHNSON, Etherine Mackowiak L on: 08/25/2017 11:33 AM   Modules accepted: Orders

## 2017-10-19 DIAGNOSIS — M5416 Radiculopathy, lumbar region: Secondary | ICD-10-CM | POA: Diagnosis not present

## 2017-10-19 DIAGNOSIS — M9904 Segmental and somatic dysfunction of sacral region: Secondary | ICD-10-CM | POA: Diagnosis not present

## 2017-10-19 DIAGNOSIS — M5417 Radiculopathy, lumbosacral region: Secondary | ICD-10-CM | POA: Diagnosis not present

## 2017-10-19 DIAGNOSIS — M9903 Segmental and somatic dysfunction of lumbar region: Secondary | ICD-10-CM | POA: Diagnosis not present

## 2017-10-21 DIAGNOSIS — M9903 Segmental and somatic dysfunction of lumbar region: Secondary | ICD-10-CM | POA: Diagnosis not present

## 2017-10-21 DIAGNOSIS — M5417 Radiculopathy, lumbosacral region: Secondary | ICD-10-CM | POA: Diagnosis not present

## 2017-10-21 DIAGNOSIS — M9904 Segmental and somatic dysfunction of sacral region: Secondary | ICD-10-CM | POA: Diagnosis not present

## 2017-10-21 DIAGNOSIS — M5416 Radiculopathy, lumbar region: Secondary | ICD-10-CM | POA: Diagnosis not present

## 2017-10-28 DIAGNOSIS — M9904 Segmental and somatic dysfunction of sacral region: Secondary | ICD-10-CM | POA: Diagnosis not present

## 2017-10-28 DIAGNOSIS — M5417 Radiculopathy, lumbosacral region: Secondary | ICD-10-CM | POA: Diagnosis not present

## 2017-10-28 DIAGNOSIS — M5416 Radiculopathy, lumbar region: Secondary | ICD-10-CM | POA: Diagnosis not present

## 2017-10-28 DIAGNOSIS — M9903 Segmental and somatic dysfunction of lumbar region: Secondary | ICD-10-CM | POA: Diagnosis not present

## 2017-11-22 ENCOUNTER — Other Ambulatory Visit: Payer: Self-pay | Admitting: Family Medicine

## 2017-11-22 NOTE — Telephone Encounter (Signed)
Refill request for general medication: Meloxicam 15 mg  Last office visit: 12/15/2016  Last physical exam: 12/15/2016  Follow-ups on file. 12/19/2017

## 2017-12-19 ENCOUNTER — Encounter: Payer: Self-pay | Admitting: Family Medicine

## 2017-12-19 ENCOUNTER — Ambulatory Visit (INDEPENDENT_AMBULATORY_CARE_PROVIDER_SITE_OTHER): Payer: BLUE CROSS/BLUE SHIELD | Admitting: Family Medicine

## 2017-12-19 VITALS — BP 112/74 | HR 51 | Temp 98.0°F | Resp 14 | Ht 71.0 in | Wt 163.2 lb

## 2017-12-19 DIAGNOSIS — N400 Enlarged prostate without lower urinary tract symptoms: Secondary | ICD-10-CM

## 2017-12-19 DIAGNOSIS — M5416 Radiculopathy, lumbar region: Secondary | ICD-10-CM | POA: Diagnosis not present

## 2017-12-19 DIAGNOSIS — Z0000 Encounter for general adult medical examination without abnormal findings: Secondary | ICD-10-CM | POA: Diagnosis not present

## 2017-12-19 DIAGNOSIS — Z13220 Encounter for screening for lipoid disorders: Secondary | ICD-10-CM

## 2017-12-19 DIAGNOSIS — Z87438 Personal history of other diseases of male genital organs: Secondary | ICD-10-CM | POA: Diagnosis not present

## 2017-12-19 DIAGNOSIS — Z113 Encounter for screening for infections with a predominantly sexual mode of transmission: Secondary | ICD-10-CM

## 2017-12-19 NOTE — Progress Notes (Addendum)
Name: Walter Tyler   MRN: [id number]    DOB: [birth year]   Date:12/19/2017       Progress Note  Subjective  Chief Complaint  Chief Complaint  Patient presents with  . Annual Exam    patient sleeps about 6hrs/ night. patient eats a well balanced diet  . Labs Only  . Follow-up    HPI  Patient presents for annual CPE and follow up ( has some concerns)   Lumbar Radiculitis: he is a runner and has noticed some pain radiating from lower back, left or right side and can radiate to his calves. He states usually worse when picking a box from the floor. No pain when running but taking longer to get to his normal stride. No weakness, bowel or bladder incontinence. Symptoms started about one year ago. He states not getting any worse.   Overuse BC: he has a history of right hand injury and has been taking BC's twice daily for pain on right hand, explained not safe, he needs to switch to Aleve in am and tylenol at night and try to wean nsaid's off.     Diet: healthy  Exercise: very active   Depression:  [facility] 2[facility] 12/19/2017 12/15/2016 12/15/2015  Decreased Interest 0 0 0  Down, Depressed, Hopeless 0 0 0  PHQ - 2 Score 0 0 0  Altered sleeping 0 - -  Tired, decreased energy 0 - -  Change in appetite 0 - -  Feeling bad or failure about yourself  0 - -  Trouble concentrating 0 - -  Moving slowly or fidgety/restless 0 - -  Suicidal thoughts 0 - -  PHQ-9 Score 0 - -  Difficult doing work/chores Not difficult at all - -    Hypertension:  BP Readings from Last 3 Encounters:  12/19/17 112/74  12/15/16 110/70  12/15/15 124/78    Obesity: Wt Readings from Last 3 Encounters:  12/19/17 163 lb 3.2 oz (74 kg)  12/15/16 167 lb 6.4 oz (75.9 kg)  12/15/15 160 lb 2 oz (72.6 kg)   BMI Readings from Last 3 Encounters:  12/19/17 22.76 kg/m  12/15/16 23.35 kg/m  12/15/15 22.33 kg/m     Lipids:  Lab Results  Component Value Date   CHOL 210 (H) 12/15/2016   CHOL 195 12/15/2015    Lab Results  Component Value Date   HDL 60 12/15/2016   HDL 68 12/15/2015   Lab Results  Component Value Date   LDLCALC 139 (H) 12/15/2016   LDLCALC 117 12/15/2015   Lab Results  Component Value Date   TRIG 57 12/15/2016   TRIG 49 12/15/2015   Lab Results  Component Value Date   CHOLHDL 3.5 12/15/2016   CHOLHDL 2.9 12/15/2015   No results found for: LDLDIRECT Glucose:  Glucose  Date Value Ref Range Status  04/27/2012 84 65 - 99 mg/dL Final   Glucose, Bld  Date Value Ref Range Status  12/15/2016 71 65 - 99 mg/dL Final  29/56/213007/24/2017 92 65 - 99 mg/dL Final      [facility]  AUDIT-C Score  0      Married STD testing and prevention (HIV/chl/gon/syphilis): he wants to be checked  Hep C: 2017  Skin cancer: discussed atypical lesions  Colorectal cancer: up to date, cologuard 01/17/2017  Prostate cancer: recheck PSA because of BPH history   Lab Results  Component Value Date   PSA 0.9 12/15/2016    IPSS  Questionnaire (AUA-7): Over the past month.   1)  How often have you had a sensation of not emptying your bladder completely after you finish urinating?  0 - Not at all  2)  How often have you had to urinate again less than two hours after you finished urinating? 1 - Less than 1 time in 5  3)  How often have you found you stopped and started again several times when you urinated?  0 - Not at all  4) How difficult have you found it to postpone urination?  0 - Not at all  5) How often have you had a weak urinary stream?  0 - Not at all  6) How often have you had to push or strain to begin urination?  0 - Not at all  7) How many times did you most typically get up to urinate from the time you went to bed until the time you got up in the morning?  0 - None  Total score:  0-7 mildly symptomatic   8-19 moderately symptomatic   20-35 severely symptomatic   Lung cancer:   Low Dose CT Chest recommended if Age 4-80 years,  30 pack-year currently smoking OR have quit w/in 15years. Patient does not qualify.   AAA:  The USPSTF recommends one-time screening with ultrasonography in men ages 51 to 69 years who have ever smoked - N/A ECG:  Today   Advanced Care Planning: A voluntary discussion about advance care planning including the explanation and discussion of advance directives.  Discussed health care proxy and Living will, and the patient was able to identify a health care proxy as wife .  Patient does not have a living will at present time.  Patient Active Problem List   Diagnosis Date Noted  . Intermittent knee pain 12/15/2015  . History of fracture due to fall 03/07/2012    Past Surgical History:  Procedure Laterality Date  . WRIST SURGERY Right 03/07/2012    Family History  Problem Relation Age of Onset  . Diabetes Mother   . Heart disease Mother   . Diabetes Father     Social History   Socioeconomic History  . Marital status: Married    Spouse name: Liborio Nixon  . Number of children: 0  . Years of education: Not on file  . Highest education level: Bachelor's degree (e.g., BA, AB, BS)  Occupational History  . Not on file  Social Needs  . Financial resource strain: Not hard at all  . Food insecurity:    Worry: Never true    Inability: Never true  . Transportation needs:    Medical: No    Non-medical: No  Tobacco Use  . Smoking status: Never Smoker  . Smokeless tobacco: Never Used  Substance and Sexual Activity  . Alcohol use: No  . Drug use: No  . Sexual activity: Yes    Partners: Female    Birth control/protection: None  Lifestyle  . Physical activity:    Days per week: 7 days    Minutes per session: 120 min  . Stress: Not at all  Relationships  . Social connections:    Talks on phone: More than three times a week    Gets together: More than three times a week    Attends religious service: More than 4 times per year    Active member of club or organization: Yes    Attends  meetings of clubs or organizations: 1 to 4 times per  year    Relationship status: Married  . Intimate partner violence:    Fear of current or ex partner: No    Emotionally abused: No    Physically abused: No    Forced sexual activity: No  Other Topics Concern  . Not on file  Social History Narrative  . Not on file     Current Outpatient Medications:  .  IRON PO, Take by mouth., Disp: , Rfl:  .  meloxicam (MOBIC) 15 MG tablet, TAKE 1 TABLET (15 MG TOTAL) BY MOUTH DAILY., Disp: 30 tablet, Rfl: 0 .  Multiple Vitamins-Minerals (MULTIVITAMIN ADULTS 50+ PO), Take by mouth., Disp: , Rfl:  .  VITAMIN E PO, Take by mouth., Disp: , Rfl:   No Known Allergies   ROS  Constitutional: Negative for fever or weight change.  Respiratory: Negative for cough and shortness of breath.   Cardiovascular: Negative for chest pain or palpitations.  Gastrointestinal: Negative for abdominal pain, no bowel changes.  Musculoskeletal: Negative for gait problem or joint swelling.  Skin: Negative for rash.  Neurological: Negative for dizziness or headache.  No other specific complaints in a complete review of systems (except as listed in HPI above).  Objective  Vitals:   12/19/17 0828  BP: 112/74  Pulse: (!) 51  Resp: 14  Temp: 98 F (36.7 C)  TempSrc: Oral  SpO2: 98%  Weight: 163 lb 3.2 oz (74 kg)  Height: 5\' 11"  (1.803 m)    Body mass index is 22.76 kg/m.  Physical Exam  Constitutional: Patient appears well-developed and well-nourished. No distress.  HENT: Head: Normocephalic and atraumatic. Ears: B TMs ok, no erythema or effusion; Nose: Nose normal. Mouth/Throat: Oropharynx is clear and moist. No oropharyngeal exudate.  Eyes: Conjunctivae and EOM are normal. Pupils are equal, round, and reactive to light. No scleral icterus.  Neck: Normal range of motion. Neck supple. No JVD present. No thyromegaly present.  Cardiovascular: Normal rate, regular rhythm and normal heart sounds.  No murmur  heard. No BLE edema. Pulmonary/Chest: Effort normal and breath sounds normal. No respiratory distress. Abdominal: Soft. Bowel sounds are normal, no distension. There is no tenderness. no masses MALE GENITALIA: Normal descended testes bilaterally, no masses palpated, no hernias, no lesions, no discharge RECTAL: enlarged prostate normal size and consistency, no rectal masses or hemorrhoids Musculoskeletal: Normal range of motion, no joint effusions. No gross deformities Neurological: he is alert and oriented to person, place, and time. No cranial nerve deficit. Coordination, balance, strength, speech and gait are normal.  Skin: Skin is warm and dry. No rash noted. No erythema.  Psychiatric: Patient has a normal mood and affect. behavior is normal. Judgment and thought content normal.  PHQ2/9: Depression screen [facility] 2/9 12/19/2017 12/15/2016 12/15/2015  Decreased Interest 0 0 0  Down, Depressed, Hopeless 0 0 0  PHQ - 2 Score 0 0 0  Altered sleeping 0 - -  Tired, decreased energy 0 - -  Change in appetite 0 - -  Feeling bad or failure about yourself  0 - -  Trouble concentrating 0 - -  Moving slowly or fidgety/restless 0 - -  Suicidal thoughts 0 - -  PHQ-9 Score 0 - -  Difficult doing work/chores Not difficult at all - -    Fall Risk: Fall Risk  12/19/2017 12/15/2015  Falls in the past year? No No    Functional Status Survey: Is the patient deaf or have difficulty hearing?: No Does the patient have difficulty seeing, even when wearing  glasses/contacts?: No Does the patient have difficulty concentrating, remembering, or making decisions?: No Does the patient have difficulty walking or climbing stairs?: No Does the patient have difficulty dressing or bathing?: No Does the patient have difficulty doing errands alone such as visiting a doctor's office or shopping?: No    Assessment & Plan  1. Encounter for annual physical exam  - EKG 12-Lead - COMPLETE METABOLIC PANEL WITH GFR - CBC  with Differential/Platelet - Lipid panel  2.BPH  - PSA  3. Lipid screening  - Lipid panel  4. Routine screening for STI (sexually transmitted infection)  - HIV antibody - RPR   5. Lumbar radiculitis  Symptoms are intermittent, discussed doing a one mile warm up prior to running, also to avoid using back when lifting box and rely on quad muscles, symptoms intermittent and no indication for further testing or prednisone at this tim   -Prostate cancer screening and PSA options (with potential risks and benefits of testing vs not testing) were discussed along with recent recs/guidelines. -USPSTF grade A and B recommendations reviewed with patient; age-appropriate recommendations, preventive care, screening tests, etc discussed and encouraged; healthy living encouraged; see AVS for patient education given to patient -Discussed importance of 150 minutes of physical activity weekly, eat two servings of fish weekly, eat one serving of tree nuts ( cashews, pistachios, pecans, almonds.Marland Kitchen) every other day, eat 6 servings of fruit/vegetables daily and drink plenty of water and avoid sweet beverages.

## 2017-12-19 NOTE — Patient Instructions (Signed)

## 2017-12-20 ENCOUNTER — Other Ambulatory Visit: Payer: Self-pay | Admitting: Family Medicine

## 2017-12-20 LAB — RPR: RPR Ser Ql: NONREACTIVE

## 2017-12-20 LAB — CBC WITH DIFFERENTIAL/PLATELET
BASOS PCT: 0.6 %
Basophils Absolute: 22 cells/uL (ref 0–200)
Eosinophils Absolute: 22 cells/uL (ref 15–500)
Eosinophils Relative: 0.6 %
HCT: 43.6 % (ref 38.5–50.0)
HEMOGLOBIN: 14.7 g/dL (ref 13.2–17.1)
LYMPHS ABS: 1094 {cells}/uL (ref 850–3900)
MCH: 29.8 pg (ref 27.0–33.0)
MCHC: 33.7 g/dL (ref 32.0–36.0)
MCV: 88.4 fL (ref 80.0–100.0)
MONOS PCT: 9 %
MPV: 9.8 fL (ref 7.5–12.5)
NEUTROS ABS: 2138 {cells}/uL (ref 1500–7800)
Neutrophils Relative %: 59.4 %
Platelets: 193 10*3/uL (ref 140–400)
RBC: 4.93 10*6/uL (ref 4.20–5.80)
RDW: 13.3 % (ref 11.0–15.0)
Total Lymphocyte: 30.4 %
WBC mixed population: 324 cells/uL (ref 200–950)
WBC: 3.6 10*3/uL — ABNORMAL LOW (ref 3.8–10.8)

## 2017-12-20 LAB — LIPID PANEL
CHOL/HDL RATIO: 3.6 (calc) (ref <5.0)
CHOLESTEROL: 204 mg/dL — AB (ref <200)
HDL: 57 mg/dL (ref >40)
LDL Cholesterol (Calc): 131 mg/dL (calc) — ABNORMAL HIGH
Non-HDL Cholesterol (Calc): 147 mg/dL (calc) — ABNORMAL HIGH (ref <130)
TRIGLYCERIDES: 66 mg/dL (ref <150)

## 2017-12-20 LAB — COMPLETE METABOLIC PANEL WITH GFR
AG RATIO: 1.5 (calc) (ref 1.0–2.5)
ALT: 28 U/L (ref 9–46)
AST: 34 U/L (ref 10–35)
Albumin: 4.3 g/dL (ref 3.6–5.1)
Alkaline phosphatase (APISO): 47 U/L (ref 40–115)
BILIRUBIN TOTAL: 0.5 mg/dL (ref 0.2–1.2)
BUN: 13 mg/dL (ref 7–25)
CALCIUM: 9.2 mg/dL (ref 8.6–10.3)
CHLORIDE: 106 mmol/L (ref 98–110)
CO2: 29 mmol/L (ref 20–32)
Creat: 1.06 mg/dL (ref 0.70–1.25)
GFR, EST NON AFRICAN AMERICAN: 75 mL/min/{1.73_m2} (ref >=60)
GFR, Est African American: 87 mL/min/{1.73_m2} (ref >=60)
Globulin: 2.8 g/dL (calc) (ref 1.9–3.7)
Glucose, Bld: 73 mg/dL (ref 65–99)
POTASSIUM: 4 mmol/L (ref 3.5–5.3)
Sodium: 140 mmol/L (ref 135–146)
Total Protein: 7.1 g/dL (ref 6.1–8.1)

## 2017-12-20 LAB — HIV ANTIBODY (ROUTINE TESTING W REFLEX): HIV 1&2 Ab, 4th Generation: NONREACTIVE

## 2017-12-20 LAB — PSA: PSA: 1.1 ng/mL (ref <=4.0)

## 2017-12-20 NOTE — Telephone Encounter (Signed)
Unable to leave a message due to voicemail box not being set up. Not able to take Meloxicam due to taking otc BC's.

## 2017-12-20 NOTE — Telephone Encounter (Signed)
He cannot take BC's and meloxicam.

## 2017-12-21 NOTE — Telephone Encounter (Signed)
Pt. Informed of Dr. Carlynn PurlSowles message. States he will not take together.

## 2017-12-21 NOTE — Telephone Encounter (Signed)
Sending it but he cannot take BC powder same day as meloxicam

## 2018-03-19 ENCOUNTER — Other Ambulatory Visit: Payer: Self-pay | Admitting: Family Medicine

## 2018-04-10 DIAGNOSIS — M545 Low back pain, unspecified: Secondary | ICD-10-CM | POA: Insufficient documentation

## 2018-04-25 DIAGNOSIS — M533 Sacrococcygeal disorders, not elsewhere classified: Secondary | ICD-10-CM | POA: Diagnosis not present

## 2018-04-25 DIAGNOSIS — M6281 Muscle weakness (generalized): Secondary | ICD-10-CM | POA: Diagnosis not present

## 2018-05-22 DIAGNOSIS — M6281 Muscle weakness (generalized): Secondary | ICD-10-CM | POA: Diagnosis not present

## 2018-06-14 ENCOUNTER — Other Ambulatory Visit: Payer: Self-pay | Admitting: Family Medicine

## 2018-06-14 MED ORDER — MELOXICAM 7.5 MG PO TABS: 7.5000 mg | ORAL_TABLET | Freq: Every day | ORAL | 0 refills | Status: DC | PRN

## 2018-06-14 NOTE — Telephone Encounter (Signed)
Refill request for general medication. Meloxicam  Last office visit 12/19/17   Follow up on 12/22/2018

## 2018-12-22 ENCOUNTER — Encounter: Payer: Self-pay | Admitting: Family Medicine

## 2018-12-22 ENCOUNTER — Ambulatory Visit (INDEPENDENT_AMBULATORY_CARE_PROVIDER_SITE_OTHER): Payer: BC Managed Care – PPO | Admitting: Family Medicine

## 2018-12-22 ENCOUNTER — Other Ambulatory Visit: Payer: Self-pay

## 2018-12-22 VITALS — BP 140/90 | HR 55 | Temp 97.1°F | Resp 16 | Ht 69.75 in | Wt 164.5 lb

## 2018-12-22 DIAGNOSIS — Z131 Encounter for screening for diabetes mellitus: Secondary | ICD-10-CM | POA: Diagnosis not present

## 2018-12-22 DIAGNOSIS — N400 Enlarged prostate without lower urinary tract symptoms: Secondary | ICD-10-CM | POA: Diagnosis not present

## 2018-12-22 DIAGNOSIS — Z0000 Encounter for general adult medical examination without abnormal findings: Secondary | ICD-10-CM | POA: Diagnosis not present

## 2018-12-22 DIAGNOSIS — Z13220 Encounter for screening for lipoid disorders: Secondary | ICD-10-CM

## 2018-12-22 DIAGNOSIS — D708 Other neutropenia: Secondary | ICD-10-CM

## 2018-12-22 NOTE — Patient Instructions (Signed)
Preventive Care 40-64 Years Old, Male Preventive care refers to lifestyle choices and visits with your health care provider that can promote health and wellness. This includes:  A yearly physical exam. This is also called an annual well check.  Regular dental and eye exams.  Immunizations.  Screening for certain conditions.  Healthy lifestyle choices, such as eating a healthy diet, getting regular exercise, not using drugs or products that contain nicotine and tobacco, and limiting alcohol use. What can I expect for my preventive care visit? Physical exam Your health care provider will check:  Height and weight. These may be used to calculate body mass index (BMI), which is a measurement that tells if you are at a healthy weight.  Heart rate and blood pressure.  Your skin for abnormal spots. Counseling Your health care provider may ask you questions about:  Alcohol, tobacco, and drug use.  Emotional well-being.  Home and relationship well-being.  Sexual activity.  Eating habits.  Work and work environment. What immunizations do I need?  Influenza (flu) vaccine  This is recommended every year. Tetanus, diphtheria, and pertussis (Tdap) vaccine  You may need a Td booster every 10 years. Varicella (chickenpox) vaccine  You may need this vaccine if you have not already been vaccinated. Zoster (shingles) vaccine  You may need this after age 60. Measles, mumps, and rubella (MMR) vaccine  You may need at least one dose of MMR if you were born in 1957 or later. You may also need a second dose. Pneumococcal conjugate (PCV13) vaccine  You may need this if you have certain conditions and were not previously vaccinated. Pneumococcal polysaccharide (PPSV23) vaccine  You may need one or two doses if you smoke cigarettes or if you have certain conditions. Meningococcal conjugate (MenACWY) vaccine  You may need this if you have certain conditions. Hepatitis A vaccine   You may need this if you have certain conditions or if you travel or work in places where you may be exposed to hepatitis A. Hepatitis B vaccine  You may need this if you have certain conditions or if you travel or work in places where you may be exposed to hepatitis B. Haemophilus influenzae type b (Hib) vaccine  You may need this if you have certain risk factors. Human papillomavirus (HPV) vaccine  If recommended by your health care provider, you may need three doses over 6 months. You may receive vaccines as individual doses or as more than one vaccine together in one shot (combination vaccines). Talk with your health care provider about the risks and benefits of combination vaccines. What tests do I need? Blood tests  Lipid and cholesterol levels. These may be checked every 5 years, or more frequently if you are over 50 years old.  Hepatitis C test.  Hepatitis B test. Screening  Lung cancer screening. You may have this screening every year starting at age 55 if you have a 30-pack-year history of smoking and currently smoke or have quit within the past 15 years.  Prostate cancer screening. Recommendations will vary depending on your family history and other risks.  Colorectal cancer screening. All adults should have this screening starting at age 50 and continuing until age 75. Your health care provider may recommend screening at age 45 if you are at increased risk. You will have tests every 1-10 years, depending on your results and the type of screening test.  Diabetes screening. This is done by checking your blood sugar (glucose) after you have not eaten   for a while (fasting). You may have this done every 1-3 years.  Sexually transmitted disease (STD) testing. Follow these instructions at home: Eating and drinking  Eat a diet that includes fresh fruits and vegetables, whole grains, lean protein, and low-fat dairy products.  Take vitamin and mineral supplements as recommended  by your health care provider.  Do not drink alcohol if your health care provider tells you not to drink.  If you drink alcohol: ? Limit how much you have to 0-2 drinks a day. ? Be aware of how much alcohol is in your drink. In the [place] one drink equals one 12 oz bottle of beer (355 mL), one 5 oz glass of wine (148 mL), or one 1 oz glass of hard liquor (44 mL). Lifestyle  Take daily care of your teeth and gums.  Stay active. Exercise for at least 30 minutes on 5 or more days each week.  Do not use any products that contain nicotine or tobacco, such as cigarettes, e-cigarettes, and chewing tobacco. If you need help quitting, ask your health care provider.  If you are sexually active, practice safe sex. Use a condom or other form of protection to prevent STIs (sexually transmitted infections).  Talk with your health care provider about taking a low-dose aspirin every day starting at age 33. What's next?  Go to your health care provider once a year for a well check visit.  Ask your health care provider how often you should have your eyes and teeth checked.  Stay up to date on all vaccines. This information is not intended to replace advice given to you by your health care provider. Make sure you discuss any questions you have with your health care provider. Document Released: 06/06/2015 Document Revised: 05/04/2018 Document Reviewed: 05/04/2018 Elsevier Patient Education  2020 [employer].

## 2018-12-22 NOTE — Progress Notes (Signed)
Name: Walter Tyler   MRN: [id number]    DOB: [birth year]   Date:12/22/2018       Progress Note  Subjective  Chief Complaint  Chief Complaint  Patient presents with  . Annual Exam    HPI  Patient presents for annual CPE  Currently off meloxicam, he is seeing chiropractor and has SI joint pain and also pyriform syndrome , he is no longer running daily, uses a stationary bike and is feeling better, no pain as long he does not run. Knee is doing better   USPSTF grade A and B recommendations:  Diet: eats healthy  Exercise: very active and doing well   Depression: phq 9 is negative Depression screen [facility] 2/9 12/22/2018 12/19/2017 12/15/2016 12/15/2015  Decreased Interest 0 0 0 0  Down, Depressed, Hopeless 0 0 0 0  PHQ - 2 Score 0 0 0 0  Altered sleeping 0 0 - -  Tired, decreased energy 0 0 - -  Change in appetite 0 0 - -  Feeling bad or failure about yourself  0 0 - -  Trouble concentrating 0 0 - -  Moving slowly or fidgety/restless 0 0 - -  Suicidal thoughts 0 0 - -  PHQ-9 Score 0 0 - -  Difficult doing work/chores - Not difficult at all - -    Hypertension:  BP Readings from Last 3 Encounters:  12/22/18 140/90  12/19/17 112/74  12/15/16 110/70    Obesity: Wt Readings from Last 3 Encounters:  12/22/18 164 lb 8 oz (74.6 kg)  12/19/17 163 lb 3.2 oz (74 kg)  12/15/16 167 lb 6.4 oz (75.9 kg)   BMI Readings from Last 3 Encounters:  12/22/18 23.77 kg/m  12/19/17 22.76 kg/m  12/15/16 23.35 kg/m     Lipids:  Lab Results  Component Value Date   CHOL 204 (H) 12/19/2017   CHOL 210 (H) 12/15/2016   CHOL 195 12/15/2015   Lab Results  Component Value Date   HDL 57 12/19/2017   HDL 60 12/15/2016   HDL 68 12/15/2015   Lab Results  Component Value Date   LDLCALC 131 (H) 12/19/2017   LDLCALC 139 (H) 12/15/2016   LDLCALC 117 12/15/2015   Lab Results  Component Value Date   TRIG 66 12/19/2017   TRIG 57 12/15/2016   TRIG 49 12/15/2015   Lab Results  Component Value  Date   CHOLHDL 3.6 12/19/2017   CHOLHDL 3.5 12/15/2016   CHOLHDL 2.9 12/15/2015   No results found for: LDLDIRECT Glucose:  Glucose  Date Value Ref Range Status  04/27/2012 84 65 - 99 mg/dL Final   Glucose, Bld  Date Value Ref Range Status  12/19/2017 73 65 - 99 mg/dL Final    Comment:    .            Fasting reference interval .   12/15/2016 71 65 - 99 mg/dL Final  21/30/865707/24/2017 92 65 - 99 mg/dL Final      [facility]  AUDIT-C Score  0      Married STD testing and prevention (HIV/chl/gon/syphilis): N/A Hep C: up to date   Skin cancer: discussed atypical lesions Colorectal cancer:repeat in 2021  Prostate cancer: repeat yearly because of BPH  Lab Results  Component Value Date   PSA 1.1 12/19/2017   PSA 0.9 12/15/2016    IPSS Questionnaire (AUA-7): Over the past month.   1)  How often have you had a sensation  of not emptying your bladder completely after you finish urinating?  0 - Not at all  2)  How often have you had to urinate again less than two hours after you finished urinating? 1 - Less than 1 time in 5  3)  How often have you found you stopped and started again several times when you urinated?  0 - Not at all  4) How difficult have you found it to postpone urination?  0 - Not at all  5) How often have you had a weak urinary stream?  0 - Not at all  6) How often have you had to push or strain to begin urination?  0 - Not at all  7) How many times did you most typically get up to urinate from the time you went to bed until the time you got up in the morning?  1 - 1 time or less  Total score:  0-7 mildly symptomatic   8-19 moderately symptomatic   20-35 severely symptomatic    Lung cancer:  Low Dose CT Chest recommended if Age 39-80 years, 30 pack-year currently smoking OR have quit w/in 15years. Patient does not qualify.   AAA:The USPSTF recommends one-time screening with ultrasonography in men ages 2765 to 7675  years who have ever smoked ECG:  11/2017  Advanced Care Planning: A voluntary discussion about advance care planning including the explanation and discussion of advance directives.  Discussed health care proxy and Living will, and the patient was able to identify a health care proxy as wife .  Patient does not have a living will at present time.  Patient Active Problem List   Diagnosis Date Noted  . Low back pain 04/10/2018  . Intermittent knee pain 12/15/2015  . History of fracture due to fall 03/07/2012    Past Surgical History:  Procedure Laterality Date  . WRIST SURGERY Right 03/07/2012    Family History  Problem Relation Age of Onset  . Diabetes Mother   . Heart disease Mother   . Diabetes Father     Social History   Socioeconomic History  . Marital status: Married    Spouse name: Liborio NixonJanice  . Number of children: 0  . Years of education: Not on file  . Highest education level: Bachelor's degree (e.g., BA, AB, BS)  Occupational History  . Occupation: [employer]   Social Needs  . Financial resource strain: Not hard at all  . Food insecurity    Worry: Never true    Inability: Never true  . Transportation needs    Medical: No    Non-medical: No  Tobacco Use  . Smoking status: Never Smoker  . Smokeless tobacco: Never Used  Substance and Sexual Activity  . Alcohol use: No  . Drug use: No  . Sexual activity: Yes    Partners: Female    Birth control/protection: None  Lifestyle  . Physical activity    Days per week: 7 days    Minutes per session: 120 min  . Stress: Not at all  Relationships  . Social connections    Talks on phone: More than three times a week    Gets together: More than three times a week    Attends religious service: More than 4 times per year    Active member of club or organization: Yes    Attends meetings of clubs or organizations: 1 to 4 times per year    Relationship status: Married  . Intimate partner violence  Fear of current or  ex partner: No    Emotionally abused: No    Physically abused: No    Forced sexual activity: No  Other Topics Concern  . Not on file  Social History Narrative  . Not on file     Current Outpatient Medications:  .  IRON PO, Take by mouth., Disp: , Rfl:  .  Multiple Vitamins-Minerals (MULTIVITAMIN ADULTS 50+ PO), Take by mouth., Disp: , Rfl:  .  VITAMIN E PO, Take by mouth., Disp: , Rfl:   No Known Allergies   ROS  Constitutional: Negative for fever or weight change.  Respiratory: Negative for cough and shortness of breath.   Cardiovascular: Negative for chest pain or palpitations.  Gastrointestinal: Negative for abdominal pain, no bowel changes.  Musculoskeletal: Negative for gait problem or joint swelling.  Skin: Negative for rash.  Neurological: Negative for dizziness or headache.  No other specific complaints in a complete review of systems (except as listed in HPI above).   Objective  Vitals:   12/22/18 0752  BP: 140/90  Pulse: (!) 55  Resp: 16  Temp: (!) 97.1 F (36.2 C)  TempSrc: Temporal  SpO2: 97%  Weight: 164 lb 8 oz (74.6 kg)  Height: 5' 9.75" (1.772 m)    Body mass index is 23.77 kg/m.  Physical Exam  Constitutional: Patient appears well-developed and well-nourished. No distress.  HENT: Head: Normocephalic and atraumatic. Ears: B TMs ok, no erythema or effusion; Nose: Nose normal. Mouth/Throat: Oropharynx is clear and moist. No oropharyngeal exudate.  Eyes: Conjunctivae and EOM are normal. Pupils are equal, round, and reactive to light. No scleral icterus.  Neck: Normal range of motion. Neck supple. No JVD present. No thyromegaly present.  Cardiovascular: Normal rate, regular rhythm and normal heart sounds.  No murmur heard. No BLE edema. Pulmonary/Chest: Effort normal and breath sounds normal. No respiratory distress. Abdominal: Soft. Bowel sounds are normal, no distension. There is no tenderness. no masses MALE GENITALIA: Normal descended testes  bilaterally, no masses palpated, no hernias, no lesions, no discharge RECTAL: Prostate slightly enlarged, no nodules,  no rectal masses or hemorrhoids  Musculoskeletal: Normal range of motion, no joint effusions. No gross deformities Neurological: he is alert and oriented to person, place, and time. No cranial nerve deficit. Coordination, balance, strength, speech and gait are normal.  Skin: Skin is warm and dry. No rash noted. No erythema.  Psychiatric: Patient has a normal mood and affect. behavior is normal. Judgment and thought content normal.   Fall Risk: Fall Risk  12/22/2018 12/19/2017 12/15/2015  Falls in the past year? 0 No No  Number falls in past yr: 0 - -  Injury with Fall? 0 - -     Functional Status Survey: Is the patient deaf or have difficulty hearing?: No Does the patient have difficulty seeing, even when wearing glasses/contacts?: No Does the patient have difficulty concentrating, remembering, or making decisions?: No Does the patient have difficulty walking or climbing stairs?: No Does the patient have difficulty dressing or bathing?: No Does the patient have difficulty doing errands alone such as visiting a doctor's office or shopping?: No    Assessment & Plan  1. Encounter for annual physical exam  - COMPLETE METABOLIC PANEL WITH GFR  2. Benign prostatic hyperplasia without lower urinary tract symptoms  - PSA  3. Lipid screening  - Lipid panel  4. Other neutropenia (HCC)  - CBC with Differential/Platelet  5. Diabetes mellitus screening  - Hemoglobin A1c   -  Prostate cancer screening and PSA options (with potential risks and benefits of testing vs not testing) were discussed along with recent recs/guidelines. -USPSTF grade A and B recommendations reviewed with patient; age-appropriate recommendations, preventive care, screening tests, etc discussed and encouraged; healthy living encouraged; see AVS for patient education given to patient -Discussed  importance of 150 minutes of physical activity weekly, eat two servings of fish weekly, eat one serving of tree nuts ( cashews, pistachios, pecans, almonds.Marland Kitchen) every other day, eat 6 servings of fruit/vegetables daily and drink plenty of water and avoid sweet beverages.

## 2018-12-23 LAB — COMPLETE METABOLIC PANEL WITH GFR
AG Ratio: 1.5 (calc) (ref 1.0–2.5)
ALT: 31 U/L (ref 9–46)
AST: 46 U/L — ABNORMAL HIGH (ref 10–35)
Albumin: 4.4 g/dL (ref 3.6–5.1)
Alkaline phosphatase (APISO): 43 U/L (ref 35–144)
BUN: 18 mg/dL (ref 7–25)
CO2: 26 mmol/L (ref 20–32)
Calcium: 9.3 mg/dL (ref 8.6–10.3)
Chloride: 105 mmol/L (ref 98–110)
Creat: 1.11 mg/dL (ref 0.70–1.25)
GFR, Est African American: 81 mL/min/{1.73_m2} (ref >=60)
GFR, Est Non African American: 70 mL/min/{1.73_m2} (ref >=60)
Globulin: 2.9 g/dL (calc) (ref 1.9–3.7)
Glucose, Bld: 86 mg/dL (ref 65–99)
Potassium: 4 mmol/L (ref 3.5–5.3)
Sodium: 140 mmol/L (ref 135–146)
Total Bilirubin: 0.4 mg/dL (ref 0.2–1.2)
Total Protein: 7.3 g/dL (ref 6.1–8.1)

## 2018-12-23 LAB — CBC WITH DIFFERENTIAL/PLATELET
Absolute Monocytes: 384 cells/uL (ref 200–950)
Basophils Absolute: 20 cells/uL (ref 0–200)
Basophils Relative: 0.5 %
Eosinophils Absolute: 20 cells/uL (ref 15–500)
Eosinophils Relative: 0.5 %
HCT: 43.4 % (ref 38.5–50.0)
Hemoglobin: 14.9 g/dL (ref 13.2–17.1)
Lymphs Abs: 1376 cells/uL (ref 850–3900)
MCH: 30.6 pg (ref 27.0–33.0)
MCHC: 34.3 g/dL (ref 32.0–36.0)
MCV: 89.1 fL (ref 80.0–100.0)
MPV: 9.5 fL (ref 7.5–12.5)
Monocytes Relative: 9.6 %
Neutro Abs: 2200 cells/uL (ref 1500–7800)
Neutrophils Relative %: 55 %
Platelets: 196 10*3/uL (ref 140–400)
RBC: 4.87 10*6/uL (ref 4.20–5.80)
RDW: 13.7 % (ref 11.0–15.0)
Total Lymphocyte: 34.4 %
WBC: 4 10*3/uL (ref 3.8–10.8)

## 2018-12-23 LAB — PSA: PSA: 0.9 ng/mL (ref <=4.0)

## 2018-12-23 LAB — LIPID PANEL
Cholesterol: 209 mg/dL — ABNORMAL HIGH (ref <200)
HDL: 56 mg/dL (ref >=40)
LDL Cholesterol (Calc): 139 mg/dL (calc) — ABNORMAL HIGH
Non-HDL Cholesterol (Calc): 153 mg/dL (calc) — ABNORMAL HIGH (ref <130)
Total CHOL/HDL Ratio: 3.7 (calc) (ref <5.0)
Triglycerides: 53 mg/dL (ref <150)

## 2018-12-23 LAB — HEMOGLOBIN A1C
Hgb A1c MFr Bld: 5.3 % of total Hgb (ref <5.7)
Mean Plasma Glucose: 105 (calc)
eAG (mmol/L): 5.8 (calc)

## 2018-12-29 ENCOUNTER — Telehealth: Payer: Self-pay

## 2018-12-29 NOTE — Telephone Encounter (Signed)
Copied from [place] [phone]. Topic: Medical Record Request - Patient ROI Request >> Dec 29, 2018 12:26 PM Nils Flack wrote: Patient Name/DOB/MRN #: Walter Tyler - [id number] - [birth year] Requestor Name/Agency: self  Call Back #:  Information Requested: needs a hard copy of mist recent labs from 12/22/18 to fil out info for bcbs    Route to [employer] for [place] clinics. For all other clinics, route to the clinic's PEC Pool.

## 2018-12-29 NOTE — Telephone Encounter (Signed)
Most recent labs from 12/22/2018 are printed and upfront for him to pick up. Unable to reach patient due to no voicemail.

## 2018-12-29 NOTE — Telephone Encounter (Signed)
Copied from CRM #[id number]. Topic: Medical Record Request - Patient ROI Request >> Dec 29, 2018 12:26 PM Weikart, Melissa J wrote: Patient Name/DOB/MRN #: Walter Tyler - [id number] - 12/20/1955 Requestor Name/Agency: self  Call Back #:  Information Requested: needs a hard copy of mist recent labs from 12/22/18 to fil out info for bcbs    Route to [facility] HIM Pool for [place] clinics. For all other clinics, route to the clinic's PEC Pool. 

## 2019-04-18 ENCOUNTER — Ambulatory Visit
Admission: EM | Admit: 2019-04-18 | Discharge: 2019-04-18 | Disposition: A | Discharge status: Home / Self Care | Payer: BC Managed Care – PPO | Attending: Emergency Medicine | Admitting: Emergency Medicine

## 2019-04-18 ENCOUNTER — Encounter: Payer: Self-pay | Admitting: Emergency Medicine

## 2019-04-18 ENCOUNTER — Other Ambulatory Visit: Payer: Self-pay

## 2019-04-18 DIAGNOSIS — R0981 Nasal congestion: Secondary | ICD-10-CM | POA: Diagnosis not present

## 2019-04-18 DIAGNOSIS — Z20828 Contact with and (suspected) exposure to other viral communicable diseases: Secondary | ICD-10-CM

## 2019-04-18 DIAGNOSIS — Z20822 Contact with and (suspected) exposure to covid-19: Secondary | ICD-10-CM

## 2019-04-18 LAB — SARS CORONAVIRUS 2 AG (30 MIN TAT): SARS Coronavirus 2 Ag: NEGATIVE

## 2019-04-18 MED ORDER — FLUTICASONE PROPIONATE 50 MCG/ACT NA SUSP: 2.0000 | Freq: Every day | NASAL | 0 refills | Status: DC

## 2019-04-18 NOTE — ED Provider Notes (Signed)
HPI  SUBJECTIVE:  Walter Tyler is a 63 y.o. male who presents for Covid testing.  Patient states that his wife tested positive last night.  He reports 1 week of nasal congestion accompanied with sinus pain and pressure, that has resolved.  He states the nasal congestion has gotten better over the past several days with Sudafed.  He has also been pushing fluids.  No aggravating factors.  No fevers, body aches, headaches, sore throat, loss of sense of smell or taste, nausea, vomiting, abdominal pain, diarrhea, cough, shortness of breath.  Past medical history negative for pulmonary disease, diabetes, smoking, immunocompromise, chronic kidney disease, cancer.  ZOX:WRUEAV, Drue Stager, MD   Past Medical History:  Diagnosis Date  . BPH (benign prostatic hyperplasia)   . Shoulder pain, acute     Past Surgical History:  Procedure Laterality Date  . WRIST SURGERY Right 03/07/2012    Family History  Problem Relation Age of Onset  . Diabetes Mother   . Heart disease Mother   . Diabetes Father     Social History   Tobacco Use  . Smoking status: Never Smoker  . Smokeless tobacco: Never Used  Substance Use Topics  . Alcohol use: No  . Drug use: No    No current facility-administered medications for this encounter.   Current Outpatient Medications:  Marland Kitchen  Multiple Vitamins-Minerals (MULTIVITAMIN ADULTS 50+ PO), Take by mouth., Disp: , Rfl:  .  fluticasone (FLONASE) 50 MCG/ACT nasal spray, Place 2 sprays into both nostrils daily., Disp: 16 g, Rfl: 0  No Known Allergies   ROS  As noted in HPI.   Physical Exam  BP 123/77 (BP Location: Right Arm)   Pulse 60   Temp 98.6 F (37 C) (Oral)   Resp 18   Ht _0  (1.778 m)   Wt 70.3 kg   SpO2 97%   BMI 22.24 kg/m   Constitutional: Well developed, well nourished, no acute distress Eyes:  EOMI, conjunctiva normal bilaterally HENT: Normocephalic, atraumatic,mucus membranes moist.  No nasal congestion.  No sinus tenderness. Respiratory:  Normal inspiratory effort, lungs clear bilaterally, good air movement Cardiovascular: Normal rate no murmurs rubs or gallops GI: nondistended skin: No rash, skin intact Musculoskeletal: no deformities Neurologic: Alert & oriented x 3, no focal neuro deficits Psychiatric: Speech and behavior appropriate   ED Course   Medications - No data to display  Orders Placed This Encounter  Procedures  . SARS Coronavirus 2 Ag (30 min TAT) - Nasal Swab (BD Veritor Kit)    Standing Status:   Standing    Number of Occurrences:   1    Order Specific Question:   Is this test for diagnosis or screening    Answer:   Diagnosis of ill patient    Order Specific Question:   Symptomatic for COVID-19 as defined by CDC    Answer:   Yes    Order Specific Question:   Date of Symptom Onset    Answer:   04/13/2019    Order Specific Question:   Hospitalized for COVID-19    Answer:   No    Order Specific Question:   Admitted to ICU for COVID-19    Answer:   No    Order Specific Question:   Previously tested for COVID-19    Answer:   No    Order Specific Question:   Resident in a congregate (group) care setting    Answer:   No    Order Specific Question:  Employed in healthcare setting    Answer:   No  . Novel Coronavirus, NAA (Hosp order, Send-out to Ref Lab; TAT 18-24 hrs    Standing Status:   Standing    Number of Occurrences:   1    Order Specific Question:   Is this test for diagnosis or screening    Answer:   Screening    Order Specific Question:   Symptomatic for COVID-19 as defined by CDC    Answer:   No    Order Specific Question:   Hospitalized for COVID-19    Answer:   No    Order Specific Question:   Admitted to ICU for COVID-19    Answer:   No    Order Specific Question:   Previously tested for COVID-19    Answer:   No    Order Specific Question:   Resident in a congregate (group) care setting    Answer:   No    Order Specific Question:   Employed in healthcare setting    Answer:   No   . Airborne and Contact precautions    Standing Status:   Standing    Number of Occurrences:   1    Results for orders placed or performed during the hospital encounter of 04/18/19 (from the past 24 hour(s))  SARS Coronavirus 2 Ag (30 min TAT) - Nasal Swab (BD Veritor Kit)     Status: None   Collection Time: 04/18/19  8:27 AM   Specimen: Nasal Swab (BD Veritor Kit)  Result Value Ref Range   SARS Coronavirus 2 Ag NEGATIVE NEGATIVE   No results found.  ED Clinical Impression  1. Close exposure to COVID-19 virus   2. Nasal congestion      ED Assessment/Plan  Rapid Covid negative.  Sent off formal PCR testing.  Advised patient to quarantine self for 10 days.  No sinus tenderness concerning for sinusitis.  Home with saline nasal irrigation, Flonase, continue Sudafed.  Follow-up with his doctor or may return here if sinus symptoms persist after 10 days or get worse.  He states that they are getting much better today.  Discussed labs, MDM, treatment plan, and plan for follow-up with patient. . patient agrees with plan.   Meds ordered this encounter  Medications  . fluticasone (FLONASE) 50 MCG/ACT nasal spray    Sig: Place 2 sprays into both nostrils daily.    Dispense:  16 g    Refill:  0    *This clinic note was created using [employer]. Therefore, there may be occasional mistakes despite careful proofreading.   ?    Melynda Ripple, MD 04/18/19 [phone]

## 2019-04-18 NOTE — ED Triage Notes (Signed)
Patient states he has had nasal congestion x 4-5 days. His wife tested positive for COVID last night.

## 2019-04-18 NOTE — Discharge Instructions (Addendum)
Rapid Covid test is negative, so we have sent out a PCR test.  It should be back in for 24 to 48 hours.  Quarantine yourself for 10 days.  Try some saline nasal irrigation with a Milta Deiters med sinus rinse and distilled water as often as you want, Flonase for the nasal congestion.  Continue Sudafed, aching extra fluids.  Return here or see your doctor if you sinus pain and pressure returns after 10 days of being ill, you have fevers above 100.4, or other concerns

## 2019-04-19 LAB — NOVEL CORONAVIRUS, NAA (HOSP ORDER, SEND-OUT TO REF LAB; TAT 18-24 HRS): SARS-CoV-2, NAA: NOT DETECTED

## 2019-09-28 ENCOUNTER — Ambulatory Visit: Payer: Self-pay | Attending: Oncology

## 2019-09-28 DIAGNOSIS — Z23 Encounter for immunization: Secondary | ICD-10-CM

## 2019-09-28 NOTE — Progress Notes (Signed)
   Covid-19 Vaccination Clinic  Name:  Walter Tyler    MRN: [id number] DOB: 23-Mar-1956  09/28/2019  Mr. Walter Tyler was observed post Covid-19 immunization for 15 minutes without incident. He was provided with Vaccine Information Sheet and instruction to access the V-Safe system.   Mr. Walter Tyler was instructed to call 911 with any severe reactions post vaccine: Marland Kitchen Difficulty breathing  . Swelling of face and throat  . A fast heartbeat  . A bad rash all over body  . Dizziness and weakness   Immunizations Administered    Name Date Dose VIS Date Route   Pfizer COVID-19 Vaccine 09/28/2019 11:55 AM 0.3 mL 07/18/2018 Intramuscular   Manufacturer: [employer], [employer]   Lot: [id number]   NDC: [address]-4

## 2019-10-19 ENCOUNTER — Ambulatory Visit: Payer: Self-pay | Attending: Internal Medicine

## 2019-10-19 DIAGNOSIS — Z23 Encounter for immunization: Secondary | ICD-10-CM

## 2019-10-19 NOTE — Progress Notes (Signed)
   Covid-19 Vaccination Clinic  Name:  Walter Tyler    MRN: [id number] DOB: 10-31-1955  10/19/2019  Walter Tyler was observed post Covid-19 immunization for 15 minutes without incident. He was provided with Vaccine Information Sheet and instruction to access the V-Safe system.   Walter Tyler was instructed to call 911 with any severe reactions post vaccine: Marland Kitchen Difficulty breathing  . Swelling of face and throat  . A fast heartbeat  . A bad rash all over body  . Dizziness and weakness   Immunizations Administered    Name Date Dose VIS Date Route   Pfizer COVID-19 Vaccine 10/19/2019 11:49 AM 0.3 mL 07/18/2018 Intramuscular   Manufacturer: [employer], [employer]   Lot: [id number]   NDC: [address]-0

## 2019-12-28 ENCOUNTER — Encounter: Payer: Self-pay | Admitting: Family Medicine

## 2019-12-28 ENCOUNTER — Other Ambulatory Visit: Payer: Self-pay

## 2019-12-28 ENCOUNTER — Ambulatory Visit (INDEPENDENT_AMBULATORY_CARE_PROVIDER_SITE_OTHER): Payer: BC Managed Care – PPO | Admitting: Family Medicine

## 2019-12-28 VITALS — BP 120/80 | HR 50 | Temp 98.1°F | Resp 16 | Ht 69.25 in | Wt 163.9 lb

## 2019-12-28 DIAGNOSIS — Z0000 Encounter for general adult medical examination without abnormal findings: Secondary | ICD-10-CM | POA: Diagnosis not present

## 2019-12-28 DIAGNOSIS — Z131 Encounter for screening for diabetes mellitus: Secondary | ICD-10-CM

## 2019-12-28 DIAGNOSIS — E785 Hyperlipidemia, unspecified: Secondary | ICD-10-CM

## 2019-12-28 DIAGNOSIS — Z23 Encounter for immunization: Secondary | ICD-10-CM | POA: Diagnosis not present

## 2019-12-28 DIAGNOSIS — Z1211 Encounter for screening for malignant neoplasm of colon: Secondary | ICD-10-CM

## 2019-12-28 DIAGNOSIS — N400 Enlarged prostate without lower urinary tract symptoms: Secondary | ICD-10-CM | POA: Diagnosis not present

## 2019-12-28 DIAGNOSIS — M25551 Pain in right hip: Secondary | ICD-10-CM | POA: Insufficient documentation

## 2019-12-28 DIAGNOSIS — R7989 Other specified abnormal findings of blood chemistry: Secondary | ICD-10-CM | POA: Diagnosis not present

## 2019-12-28 DIAGNOSIS — G8929 Other chronic pain: Secondary | ICD-10-CM | POA: Insufficient documentation

## 2019-12-28 DIAGNOSIS — R001 Bradycardia, unspecified: Secondary | ICD-10-CM

## 2019-12-28 NOTE — Patient Instructions (Signed)
Preventive Care 41-64 Years Old, Male Preventive care refers to lifestyle choices and visits with your health care provider that can promote health and wellness. This includes:  A yearly physical exam. This is also called an annual well check.  Regular dental and eye exams.  Immunizations.  Screening for certain conditions.  Healthy lifestyle choices, such as eating a healthy diet, getting regular exercise, not using drugs or products that contain nicotine and tobacco, and limiting alcohol use. What can I expect for my preventive care visit? Physical exam Your health care provider will check:  Height and weight. These may be used to calculate body mass index (BMI), which is a measurement that tells if you are at a healthy weight.  Heart rate and blood pressure.  Your skin for abnormal spots. Counseling Your health care provider may ask you questions about:  Alcohol, tobacco, and drug use.  Emotional well-being.  Home and relationship well-being.  Sexual activity.  Eating habits.  Work and work Statistician. What immunizations do I need?  Influenza (flu) vaccine  This is recommended every year. Tetanus, diphtheria, and pertussis (Tdap) vaccine  You may need a Td booster every 10 years. Varicella (chickenpox) vaccine  You may need this vaccine if you have not already been vaccinated. Zoster (shingles) vaccine  You may need this after age 64. Measles, mumps, and rubella (MMR) vaccine  You may need at least one dose of MMR if you were born in 1957 or later. You may also need a second dose. Pneumococcal conjugate (PCV13) vaccine  You may need this if you have certain conditions and were not previously vaccinated. Pneumococcal polysaccharide (PPSV23) vaccine  You may need one or two doses if you smoke cigarettes or if you have certain conditions. Meningococcal conjugate (MenACWY) vaccine  You may need this if you have certain conditions. Hepatitis A  vaccine  You may need this if you have certain conditions or if you travel or work in places where you may be exposed to hepatitis A. Hepatitis B vaccine  You may need this if you have certain conditions or if you travel or work in places where you may be exposed to hepatitis B. Haemophilus influenzae type b (Hib) vaccine  You may need this if you have certain risk factors. Human papillomavirus (HPV) vaccine  If recommended by your health care provider, you may need three doses over 6 months. You may receive vaccines as individual doses or as more than one vaccine together in one shot (combination vaccines). Talk with your health care provider about the risks and benefits of combination vaccines. What tests do I need? Blood tests  Lipid and cholesterol levels. These may be checked every 5 years, or more frequently if you are over 60 years old.  Hepatitis C test.  Hepatitis B test. Screening  Lung cancer screening. You may have this screening every year starting at age 43 if you have a 30-pack-year history of smoking and currently smoke or have quit within the past 15 years.  Prostate cancer screening. Recommendations will vary depending on your family history and other risks.  Colorectal cancer screening. All adults should have this screening starting at age 72 and continuing until age 2. Your health care provider may recommend screening at age 14 if you are at increased risk. You will have tests every 1-10 years, depending on your results and the type of screening test.  Diabetes screening. This is done by checking your blood sugar (glucose) after you have not eaten  for a while (fasting). You may have this done every 1-3 years.  Sexually transmitted disease (STD) testing. Follow these instructions at home: Eating and drinking  Eat a diet that includes fresh fruits and vegetables, whole grains, lean protein, and low-fat dairy products.  Take vitamin and mineral supplements as  recommended by your health care provider.  Do not drink alcohol if your health care provider tells you not to drink.  If you drink alcohol: ? Limit how much you have to 0-2 drinks a day. ? Be aware of how much alcohol is in your drink. In the [place] one drink equals one 12 oz bottle of beer (355 mL), one 5 oz glass of wine (148 mL), or one 1 oz glass of hard liquor (44 mL). Lifestyle  Take daily care of your teeth and gums.  Stay active. Exercise for at least 30 minutes on 5 or more days each week.  Do not use any products that contain nicotine or tobacco, such as cigarettes, e-cigarettes, and chewing tobacco. If you need help quitting, ask your health care provider.  If you are sexually active, practice safe sex. Use a condom or other form of protection to prevent STIs (sexually transmitted infections).  Talk with your health care provider about taking a low-dose aspirin every day starting at age 53. What's next?  Go to your health care provider once a year for a well check visit.  Ask your health care provider how often you should have your eyes and teeth checked.  Stay up to date on all vaccines. This information is not intended to replace advice given to you by your health care provider. Make sure you discuss any questions you have with your health care provider. Document Revised: 05/04/2018 Document Reviewed: 05/04/2018 Elsevier Patient Education  2020 [employer].

## 2019-12-28 NOTE — Progress Notes (Signed)
Name: Walter Tyler   MRN: [id number]    DOB: 02/01/1956   Date:12/28/2019       Progress Note  Subjective  Chief Complaint  Chief Complaint  Patient presents with  . Annual Exam    HPI  Patient presents for annual CPE   USPSTF grade A and B recommendations:  Diet: balanced  Exercise: he is very physically active , still has an exercise bike and also runs.   Depression: phq 9 is negative Depression screen [facility] 2/9 12/28/2019 12/22/2018 12/19/2017 12/15/2016 12/15/2015  Decreased Interest 0 0 0 0 0  Down, Depressed, Hopeless 0 0 0 0 0  PHQ - 2 Score 0 0 0 0 0  Altered sleeping 0 0 0 - -  Tired, decreased energy 0 0 0 - -  Change in appetite 0 0 0 - -  Feeling bad or failure about yourself  0 0 0 - -  Trouble concentrating 0 0 0 - -  Moving slowly or fidgety/restless 0 0 0 - -  Suicidal thoughts 0 0 0 - -  PHQ-9 Score 0 0 0 - -  Difficult doing work/chores - - Not difficult at all - -    Hypertension:  BP Readings from Last 3 Encounters:  12/28/19 120/80  04/18/19 123/77  12/22/18 140/90    Obesity: Wt Readings from Last 3 Encounters:  12/28/19 163 lb 14.4 oz (74.3 kg)  04/18/19 155 lb (70.3 kg)  12/22/18 164 lb 8 oz (74.6 kg)   BMI Readings from Last 3 Encounters:  12/28/19 24.03 kg/m  04/18/19 22.24 kg/m  12/22/18 23.77 kg/m     Lipids:  Lab Results  Component Value Date   CHOL 209 (H) 12/22/2018   CHOL 204 (H) 12/19/2017   CHOL 210 (H) 12/15/2016   Lab Results  Component Value Date   HDL 56 12/22/2018   HDL 57 12/19/2017   HDL 60 12/15/2016   Lab Results  Component Value Date   LDLCALC 139 (H) 12/22/2018   LDLCALC 131 (H) 12/19/2017   LDLCALC 139 (H) 12/15/2016   Lab Results  Component Value Date   TRIG 53 12/22/2018   TRIG 66 12/19/2017   TRIG 57 12/15/2016   Lab Results  Component Value Date   CHOLHDL 3.7 12/22/2018   CHOLHDL 3.6 12/19/2017   CHOLHDL 3.5 12/15/2016   No results found for: LDLDIRECT Glucose:  Glucose  Date Value Ref  Range Status  04/27/2012 84 65 - 99 mg/dL Final   Glucose, Bld  Date Value Ref Range Status  12/22/2018 86 65 - 99 mg/dL Final    Comment:    .            Fasting reference interval .   12/19/2017 73 65 - 99 mg/dL Final    Comment:    .            Fasting reference interval .   12/15/2016 71 65 - 99 mg/dL Final      Office Visit from 12/28/2019 in [facility]  AUDIT-C Score 0       Married STD testing and prevention (HIV/chl/gon/syphilis): Not interested  Hep C: up to date   Skin cancer: Discussed monitoring for atypical lesions Colorectal cancer: repeat cologuard  Prostate cancer:  Lab Results  Component Value Date   PSA 0.9 12/22/2018   PSA 1.1 12/19/2017   PSA 0.9 12/15/2016    IPSS Questionnaire (AUA-7): Over the past month.   1)  How often  have you had a sensation of not emptying your bladder completely after you finish urinating?  0 - Not at all  2)  How often have you had to urinate again less than two hours after you finished urinating? 1 - Less than 1 time in 5  3)  How often have you found you stopped and started again several times when you urinated?  0 - Not at all  4) How difficult have you found it to postpone urination?  0 - Not at all  5) How often have you had a weak urinary stream?  0 - Not at all  6) How often have you had to push or strain to begin urination?  0 - Not at all  7) How many times did you most typically get up to urinate from the time you went to bed until the time you got up in the morning?  0 - None  Total score:  0-7 mildly symptomatic   8-19 moderately symptomatic   20-35 severely symptomatic    Lung cancer:  Low Dose CT Chest recommended if Age 77-80 years, 30 pack-year currently smoking OR have quit w/in 15years. Patient does not qualify.   AAA:  The USPSTF recommends one-time screening with ultrasonography in men ages 51 to 21 years who have ever smoked ECG:  11/2017   Advanced Care Planning: A  voluntary discussion about advance care planning including the explanation and discussion of advance directives.  Discussed health care proxy and Living will, and the patient was able to identify a health care proxy as wife   Patient does not have a living will at present time.  Patient Active Problem List   Diagnosis Date Noted  . Chronic right hip pain 12/28/2019  . Low back pain 04/10/2018  . Intermittent knee pain 12/15/2015  . History of fracture due to fall 03/07/2012    Past Surgical History:  Procedure Laterality Date  . WRIST SURGERY Right 03/07/2012    Family History  Problem Relation Age of Onset  . Diabetes Mother   . Heart disease Mother   . Diabetes Father     Social History   Socioeconomic History  . Marital status: Married    Spouse name: Liborio Nixon  . Number of children: 0  . Years of education: Not on file  . Highest education level: Bachelor's degree (e.g., BA, AB, BS)  Occupational History  . Occupation: cycle counter   Tobacco Use  . Smoking status: Never Smoker  . Smokeless tobacco: Never Used  Vaping Use  . Vaping Use: Never used  Substance and Sexual Activity  . Alcohol use: No  . Drug use: No  . Sexual activity: Yes    Partners: Female    Birth control/protection: None  Other Topics Concern  . Not on file  Social History Narrative  . Not on file   Social Determinants of Health   Financial Resource Strain: Low Risk   . Difficulty of Paying Living Expenses: Not hard at all  Food Insecurity: No Food Insecurity  . Worried About Programme researcher, broadcasting/film/video in the Last Year: Never true  . Ran Out of Food in the Last Year: Never true  Transportation Needs: No Transportation Needs  . Lack of Transportation (Medical): No  . Lack of Transportation (Non-Medical): No  Physical Activity: Sufficiently Active  . Days of Exercise per Week: 7 days  . Minutes of Exercise per Session: 60 min  Stress: No Stress Concern Present  . Feeling  of Stress : Not at all   Social Connections: Socially Integrated  . Frequency of Communication with Friends and Family: More than three times a week  . Frequency of Social Gatherings with Friends and Family: More than three times a week  . Attends Religious Services: More than 4 times per year  . Active Member of Clubs or Organizations: Yes  . Attends Banker Meetings: More than 4 times per year  . Marital Status: Married  Catering manager Violence: Not At Risk  . Fear of Current or Ex-Partner: No  . Emotionally Abused: No  . Physically Abused: No  . Sexually Abused: No     Current Outpatient Medications:  .  fluticasone (FLONASE) 50 MCG/ACT nasal spray, Place 2 sprays into both nostrils daily., Disp: 16 g, Rfl: 0 .  Multiple Vitamins-Minerals (MULTIVITAMIN ADULTS 50+ PO), Take by mouth., Disp: , Rfl:   No Known Allergies   ROS  Constitutional: Negative for fever or weight change.  Respiratory: Negative for cough and shortness of breath.   Cardiovascular: Negative for chest pain or palpitations.  Gastrointestinal: Negative for abdominal pain, no bowel changes.  Musculoskeletal: Negative for gait problem or joint swelling.  Skin: Negative for rash.  Neurological: Negative for dizziness or headache.  No other specific complaints in a complete review of systems (except as listed in HPI above).  Objective  Vitals:   12/28/19 0759  BP: 120/80  Pulse: (!) 50  Resp: 16  Temp: 98.1 F (36.7 C)  TempSrc: Oral  SpO2: 100%  Weight: 163 lb 14.4 oz (74.3 kg)  Height: 5' 9.25" (1.759 m)    Body mass index is 24.03 kg/m.  Physical Exam  Constitutional: Patient appears well-developed and well-nourished. No distress.  HENT: Head: Normocephalic and atraumatic. Ears: B TMs ok, no erythema or effusion; Nose: Nose normal. Mouth/Throat: not done   Eyes: Conjunctivae and EOM are normal. Pupils are equal, round, and reactive to light. No scleral icterus.  Neck: Normal range of motion. Neck  supple. No JVD present. No thyromegaly present.  Cardiovascular: Normal rate, regular rhythm and normal heart sounds.  No murmur heard. No BLE edema. Pulmonary/Chest: Effort normal and breath sounds normal. No respiratory distress. Abdominal: Soft. Bowel sounds are normal, no distension. There is no tenderness. no masses MALE GENITALIA: Normal descended testes bilaterally, no masses palpated, no hernias, no lesions, no discharge RECTAL: Prostate slightly enlarged, soft in consistency, no rectal masses or hemorrhoids  Musculoskeletal: Normal range of motion, no joint effusions. No gross deformities Neurological: he is alert and oriented to person, place, and time. No cranial nerve deficit. Coordination, balance, strength, speech and gait are normal.  Skin: Skin is warm and dry. No rash noted. No erythema.  Psychiatric: Patient has a normal mood and affect. behavior is normal. Judgment and thought content normal.  PHQ2/9: Depression screen [facility] 2/9 12/28/2019 12/22/2018 12/19/2017 12/15/2016 12/15/2015  Decreased Interest 0 0 0 0 0  Down, Depressed, Hopeless 0 0 0 0 0  PHQ - 2 Score 0 0 0 0 0  Altered sleeping 0 0 0 - -  Tired, decreased energy 0 0 0 - -  Change in appetite 0 0 0 - -  Feeling bad or failure about yourself  0 0 0 - -  Trouble concentrating 0 0 0 - -  Moving slowly or fidgety/restless 0 0 0 - -  Suicidal thoughts 0 0 0 - -  PHQ-9 Score 0 0 0 - -  Difficult doing work/chores - -  Not difficult at all - -     Fall Risk: Fall Risk  12/28/2019 12/22/2018 12/19/2017 12/15/2015  Falls in the past year? 0 0 No No  Number falls in past yr: 0 0 - -  Injury with Fall? 0 0 - -     Functional Status Survey: Is the patient deaf or have difficulty hearing?: No Does the patient have difficulty seeing, even when wearing glasses/contacts?: No Does the patient have difficulty concentrating, remembering, or making decisions?: No Does the patient have difficulty walking or climbing stairs?:  No Does the patient have difficulty dressing or bathing?: No Does the patient have difficulty doing errands alone such as visiting a doctor's office or shopping?: No    Assessment & Plan  1. Well adult exam  - Lipid panel - COMPLETE METABOLIC PANEL WITH GFR - CBC with Differential/Platelet - Hemoglobin A1c - PSA  2. Benign prostatic hyperplasia without lower urinary tract symptoms  - PSA  3. Diabetes mellitus screening  - Hemoglobin A1c  4. Dyslipidemia  - Lipid panel - COMPLETE METABOLIC PANEL WITH GFR  5. Elevated LFTs  - COMPLETE METABOLIC PANEL WITH GFR  6. Need for shingles vaccine  - Varicella-zoster vaccine IM  7. Colon cancer screening  - Cologuard  8. Bradycardia, sinus  Asymptomatic   -Prostate cancer screening and PSA options (with potential risks and benefits of testing vs not testing) were discussed along with recent recs/guidelines. -USPSTF grade A and B recommendations reviewed with patient; age-appropriate recommendations, preventive care, screening tests, etc discussed and encouraged; healthy living encouraged; see AVS for patient education given to patient -Discussed importance of 150 minutes of physical activity weekly, eat two servings of fish weekly, eat one serving of tree nuts ( cashews, pistachios, pecans, almonds.Marland Kitchen.) every other day, eat 6 servings of fruit/vegetables daily and drink plenty of water and avoid sweet beverages.

## 2019-12-29 LAB — COMPLETE METABOLIC PANEL WITH GFR
AG Ratio: 1.5 (calc) (ref 1.0–2.5)
ALT: 29 U/L (ref 9–46)
AST: 44 U/L — ABNORMAL HIGH (ref 10–35)
Albumin: 4.3 g/dL (ref 3.6–5.1)
Alkaline phosphatase (APISO): 45 U/L (ref 35–144)
BUN: 16 mg/dL (ref 7–25)
CO2: 25 mmol/L (ref 20–32)
Calcium: 9 mg/dL (ref 8.6–10.3)
Chloride: 106 mmol/L (ref 98–110)
Creat: 1.01 mg/dL (ref 0.70–1.25)
GFR, Est African American: 91 mL/min/{1.73_m2} (ref >=60)
GFR, Est Non African American: 78 mL/min/{1.73_m2} (ref >=60)
Globulin: 2.9 g/dL (calc) (ref 1.9–3.7)
Glucose, Bld: 83 mg/dL (ref 65–99)
Potassium: 4.1 mmol/L (ref 3.5–5.3)
Sodium: 140 mmol/L (ref 135–146)
Total Bilirubin: 0.4 mg/dL (ref 0.2–1.2)
Total Protein: 7.2 g/dL (ref 6.1–8.1)

## 2019-12-29 LAB — CBC WITH DIFFERENTIAL/PLATELET
Absolute Monocytes: 340 cells/uL (ref 200–950)
Basophils Absolute: 32 cells/uL (ref 0–200)
Basophils Relative: 0.9 %
Eosinophils Absolute: 21 cells/uL (ref 15–500)
Eosinophils Relative: 0.6 %
HCT: 44.9 % (ref 38.5–50.0)
Hemoglobin: 15.1 g/dL (ref 13.2–17.1)
Lymphs Abs: 1166 cells/uL (ref 850–3900)
MCH: 30.1 pg (ref 27.0–33.0)
MCHC: 33.6 g/dL (ref 32.0–36.0)
MCV: 89.4 fL (ref 80.0–100.0)
MPV: 9.6 fL (ref 7.5–12.5)
Monocytes Relative: 9.7 %
Neutro Abs: 1943 cells/uL (ref 1500–7800)
Neutrophils Relative %: 55.5 %
Platelets: 170 10*3/uL (ref 140–400)
RBC: 5.02 10*6/uL (ref 4.20–5.80)
RDW: 13.7 % (ref 11.0–15.0)
Total Lymphocyte: 33.3 %
WBC: 3.5 10*3/uL — ABNORMAL LOW (ref 3.8–10.8)

## 2019-12-29 LAB — LIPID PANEL
Cholesterol: 197 mg/dL (ref <200)
HDL: 55 mg/dL (ref >=40)
LDL Cholesterol (Calc): 127 mg/dL (calc) — ABNORMAL HIGH
Non-HDL Cholesterol (Calc): 142 mg/dL (calc) — ABNORMAL HIGH (ref <130)
Total CHOL/HDL Ratio: 3.6 (calc) (ref <5.0)
Triglycerides: 63 mg/dL (ref <150)

## 2019-12-29 LAB — HEMOGLOBIN A1C
Hgb A1c MFr Bld: 5.3 % of total Hgb (ref <5.7)
Mean Plasma Glucose: 105 (calc)
eAG (mmol/L): 5.8 (calc)

## 2019-12-29 LAB — PSA: PSA: 0.7 ng/mL (ref <=4.0)

## 2020-02-04 DIAGNOSIS — Z1211 Encounter for screening for malignant neoplasm of colon: Secondary | ICD-10-CM | POA: Diagnosis not present

## 2020-02-04 DIAGNOSIS — Z1212 Encounter for screening for malignant neoplasm of rectum: Secondary | ICD-10-CM | POA: Diagnosis not present

## 2020-02-09 LAB — COLOGUARD
COLOGUARD: NEGATIVE
Cologuard: NEGATIVE

## 2020-04-28 ENCOUNTER — Other Ambulatory Visit: Payer: Self-pay

## 2020-04-28 ENCOUNTER — Ambulatory Visit (INDEPENDENT_AMBULATORY_CARE_PROVIDER_SITE_OTHER): Payer: BC Managed Care – PPO | Admitting: Emergency Medicine

## 2020-04-28 DIAGNOSIS — Z23 Encounter for immunization: Secondary | ICD-10-CM | POA: Diagnosis not present

## 2020-04-28 DIAGNOSIS — Z09 Encounter for follow-up examination after completed treatment for conditions other than malignant neoplasm: Secondary | ICD-10-CM | POA: Diagnosis not present

## 2020-12-29 NOTE — Progress Notes (Signed)
Name: Walter Tyler   MRN: [id number]    DOB: [birth year]   Date:12/30/2020       Progress Note  Subjective  Chief Complaint  Annual Exam  HPI  Patient presents for annual CPE .  IPSS Questionnaire (AUA-7): Over the past month.   1)  How often have you had a sensation of not emptying your bladder completely after you finish urinating?  0 - Not at all  2)  How often have you had to urinate again less than two hours after you finished urinating? 1 - Less than 1 time in 5  3)  How often have you found you stopped and started again several times when you urinated?  0 - Not at all  4) How difficult have you found it to postpone urination?  0 - Not at all  5) How often have you had a weak urinary stream?  0 - Not at all  6) How often have you had to push or strain to begin urination?  0 - Not at all  7) How many times did you most typically get up to urinate from the time you went to bed until the time you got up in the morning?  1 - 1 time  Total score:  0-7 mildly symptomatic   8-19 moderately symptomatic   20-35 severely symptomatic     Diet: Balanced, discussed importance of having a high calcium diet  Exercise: continue regular physical activity   Depression: phq 9 is negative Depression screen [facility] 2/9 12/30/2020 12/28/2019 12/22/2018 12/19/2017 12/15/2016  Decreased Interest 0 0 0 0 0  Down, Depressed, Hopeless 0 0 0 0 0  PHQ - 2 Score 0 0 0 0 0  Altered sleeping - 0 0 0 -  Tired, decreased energy - 0 0 0 -  Change in appetite - 0 0 0 -  Feeling bad or failure about yourself  - 0 0 0 -  Trouble concentrating - 0 0 0 -  Moving slowly or fidgety/restless - 0 0 0 -  Suicidal thoughts - 0 0 0 -  PHQ-9 Score - 0 0 0 -  Difficult doing work/chores - - - Not difficult at all -    Hypertension:  BP Readings from Last 3 Encounters:  12/30/20 126/82  12/28/19 120/80  04/18/19 123/77    Obesity: Wt Readings from Last 3 Encounters:  12/30/20 164 lb (74.4 kg)  12/28/19 163 lb 14.4 oz  (74.3 kg)  04/18/19 155 lb (70.3 kg)   BMI Readings from Last 3 Encounters:  12/30/20 24.22 kg/m  12/28/19 24.03 kg/m  04/18/19 22.24 kg/m     Lipids:  Lab Results  Component Value Date   CHOL 197 12/28/2019   CHOL 209 (H) 12/22/2018   CHOL 204 (H) 12/19/2017   Lab Results  Component Value Date   HDL 55 12/28/2019   HDL 56 12/22/2018   HDL 57 12/19/2017   Lab Results  Component Value Date   LDLCALC 127 (H) 12/28/2019   LDLCALC 139 (H) 12/22/2018   LDLCALC 131 (H) 12/19/2017   Lab Results  Component Value Date   TRIG 63 12/28/2019   TRIG 53 12/22/2018   TRIG 66 12/19/2017   Lab Results  Component Value Date   CHOLHDL 3.6 12/28/2019   CHOLHDL 3.7 12/22/2018   CHOLHDL 3.6 12/19/2017   No results found for: LDLDIRECT Glucose:  Glucose  Date Value Ref Range Status  04/27/2012 84 65 - 99 mg/dL Final  Glucose, Bld  Date Value Ref Range Status  12/28/2019 83 65 - 99 mg/dL Final    Comment:    .            Fasting reference interval .   12/22/2018 86 65 - 99 mg/dL Final    Comment:    .            Fasting reference interval .   12/19/2017 73 65 - 99 mg/dL Final    Comment:    .            Fasting reference interval .     Flowsheet Row [facility]  AUDIT-C Score 0      Married STD testing and prevention (HIV/chl/gon/syphilis): 12/19/17 Hep C: 07/21/15  Skin cancer: Discussed monitoring for atypical lesions Colorectal cancer: 02/04/20 Prostate cancer:  Lab Results  Component Value Date   PSA 0.7 12/28/2019   PSA 0.9 12/22/2018   PSA 1.1 12/19/2017     Lung cancer:  Low Dose CT Chest recommended if Age 13-80 years, 30 pack-year currently smoking OR have quit w/in 15years. Patient does not qualify.   AAA: The USPSTF recommends one-time screening with ultrasonography in men ages 6165 to 4075 years who have ever smoked ECG:  12/09/17  Vaccines:   Shingrix: 3650-64 yo and ask insurance if covered  when patient above 65 yo Pneumonia: today  Flu: educated and discussed with patient.  Advanced Care Planning: A voluntary discussion about advance care planning including the explanation and discussion of advance directives.  Discussed health care proxy and Living will, and the patient was able to identify a health care proxy as wife.    Patient Active Problem List   Diagnosis Date Noted   Chronic right hip pain 12/28/2019   Low back pain 04/10/2018   Intermittent knee pain 12/15/2015   History of fracture due to fall 03/07/2012    Past Surgical History:  Procedure Laterality Date   WRIST SURGERY Right 03/07/2012    Family History  Problem Relation Age of Onset   Diabetes Mother    Heart disease Mother    Diabetes Father     Social History   Socioeconomic History   Marital status: Married    Spouse name: Liborio NixonJanice   Number of children: 0   Years of education: Not on file   Highest education level: Bachelor's degree (e.g., BA, AB, BS)  Occupational History   Occupation: cycle counter   Tobacco Use   Smoking status: Never   Smokeless tobacco: Never  Vaping Use   Vaping Use: Never used  Substance and Sexual Activity   Alcohol use: No   Drug use: No   Sexual activity: Yes    Partners: Female    Birth control/protection: None  Other Topics Concern   Not on file  Social History Narrative   Not on file   Social Determinants of Health   Financial Resource Strain: Low Risk    Difficulty of Paying Living Expenses: Not hard at all  Food Insecurity: No Food Insecurity   Worried About Programme researcher, broadcasting/film/videounning Out of Food in the Last Year: Never true   Ran Out of Food in the Last Year: Never true  Transportation Needs: No Transportation Needs   Lack of Transportation (Medical): No   Lack of Transportation (Non-Medical): No  Physical Activity: Sufficiently Active   Days of Exercise per Week: 7 days   Minutes of Exercise per Session: 80 min  Stress: No Stress Concern Present   Feeling  of Stress : Not at all  Social Connections: Socially Integrated   Frequency of Communication with Friends and Family: Twice a week   Frequency of Social Gatherings with Friends and Family: Three times a week   Attends Religious Services: More than 4 times per year   Active Member of Clubs or Organizations: Yes   Attends Engineer, structural: More than 4 times per year   Marital Status: Married  Catering manager Violence: Not At Risk   Fear of Current or Ex-Partner: No   Emotionally Abused: No   Physically Abused: No   Sexually Abused: No     Current Outpatient Medications:    Multiple Vitamins-Minerals (MULTIVITAMIN ADULTS 50+ PO), Take by mouth., Disp: , Rfl:    fluticasone (FLONASE) 50 MCG/ACT nasal spray, Place 2 sprays into both nostrils daily. (Patient not taking: Reported on 12/30/2020), Disp: 16 g, Rfl: 0  No Known Allergies   ROS  Constitutional: Negative for fever or weight change.  Respiratory: Negative for cough and shortness of breath.   Cardiovascular: Negative for chest pain or palpitations.  Gastrointestinal: Negative for abdominal pain, no bowel changes.  Musculoskeletal: Negative for gait problem or joint swelling.  Skin: Negative for rash.  Neurological: Negative for dizziness or headache.  No other specific complaints in a complete review of systems (except as listed in HPI above).    Objective  Vitals:   12/30/20 0741  BP: 126/82  Pulse: (!) 54  Resp: 16  Temp: 98.2 F (36.8 C)  SpO2: 97%  Weight: 164 lb (74.4 kg)  Height: 5\' 9"  (1.753 m)    Body mass index is 24.22 kg/m.  Physical Exam  Constitutional: Patient appears well-developed and well-nourished. No distress.  HENT: Head: Normocephalic and atraumatic. Ears: B TMs ok, no erythema or effusion; Nose: Nose normal. Mouth/Throat: not done  Eyes: Conjunctivae and EOM are normal. Pupils are equal, round, and reactive to light. No scleral icterus.  Neck: Normal range of motion. Neck  supple. No JVD present. No thyromegaly present.  Cardiovascular: Normal rate, regular rhythm and normal heart sounds.  No murmur heard. No BLE edema. Pulmonary/Chest: Effort normal and breath sounds normal. No respiratory distress. Abdominal: Soft. Bowel sounds are normal, no distension. There is no tenderness. no masses MALE GENITALIA: Normal descended testes bilaterally, no masses palpated, no hernias, no lesions, no discharge RECTAL: Prostate enlarged in  size but normal consistency, no rectal masses or hemorrhoids  Musculoskeletal: Normal range of motion, no joint effusions. No gross deformities Neurological: he is alert and oriented to person, place, and time. No cranial nerve deficit. Coordination, balance, strength, speech and gait are normal.  Skin: Skin is warm and dry. No rash noted. No erythema.  Psychiatric: Patient has a normal mood and affect. behavior is normal. Judgment and thought content normal.   Fall Risk: Fall Risk  12/30/2020 12/28/2019 12/22/2018 12/19/2017 12/15/2015  Falls in the past year? 0 0 0 No No  Number falls in past yr: 0 0 0 - -  Injury with Fall? 0 0 0 - -  Risk for fall due to : No Fall Risks - - - -  Follow up Falls prevention discussed - - - -      Functional Status Survey: Is the patient deaf or have difficulty hearing?: No Does the patient have difficulty seeing, even when wearing glasses/contacts?: No Does the patient have difficulty concentrating, remembering, or making decisions?: No Does the  patient have difficulty walking or climbing stairs?: No Does the patient have difficulty dressing or bathing?: No Does the patient have difficulty doing errands alone such as visiting a doctor's office or shopping?: No   Assessment & Plan  1. Well adult exam  - Lipid panel - COMPLETE METABOLIC PANEL WITH GFR - CBC with Differential/Platelet - Hemoglobin A1c - PSA  2. Need for pneumococcal vaccination  - Pneumococcal conjugate vaccine 20-valent  (Prevnar 20)  3. Diabetes mellitus screening  - Hemoglobin A1c  4. Dyslipidemia  - Lipid panel  5. Benign prostatic hyperplasia without lower urinary tract symptoms  - PSA     -Prostate cancer screening and PSA options (with potential risks and benefits of testing vs not testing) were discussed along with recent recs/guidelines. -USPSTF grade A and B recommendations reviewed with patient; age-appropriate recommendations, preventive care, screening tests, etc discussed and encouraged; healthy living encouraged; see AVS for patient education given to patient -Discussed importance of 150 minutes of physical activity weekly, eat two servings of fish weekly, eat one serving of tree nuts ( cashews, pistachios, pecans, almonds.Marland Kitchen) every other day, eat 6 servings of fruit/vegetables daily and drink plenty of water and avoid sweet beverages.

## 2020-12-30 ENCOUNTER — Other Ambulatory Visit: Payer: Self-pay

## 2020-12-30 ENCOUNTER — Encounter: Payer: Self-pay | Admitting: Family Medicine

## 2020-12-30 ENCOUNTER — Ambulatory Visit (INDEPENDENT_AMBULATORY_CARE_PROVIDER_SITE_OTHER): Payer: BC Managed Care – PPO | Admitting: Family Medicine

## 2020-12-30 VITALS — BP 126/82 | HR 54 | Temp 98.2°F | Resp 16 | Ht 69.0 in | Wt 164.0 lb

## 2020-12-30 DIAGNOSIS — Z23 Encounter for immunization: Secondary | ICD-10-CM

## 2020-12-30 DIAGNOSIS — E785 Hyperlipidemia, unspecified: Secondary | ICD-10-CM | POA: Diagnosis not present

## 2020-12-30 DIAGNOSIS — Z131 Encounter for screening for diabetes mellitus: Secondary | ICD-10-CM | POA: Diagnosis not present

## 2020-12-30 DIAGNOSIS — N400 Enlarged prostate without lower urinary tract symptoms: Secondary | ICD-10-CM | POA: Diagnosis not present

## 2020-12-30 DIAGNOSIS — Z0000 Encounter for general adult medical examination without abnormal findings: Secondary | ICD-10-CM | POA: Diagnosis not present

## 2020-12-30 NOTE — Patient Instructions (Signed)
Preventive Care 65 Years and Older, Male Preventive care refers to lifestyle choices and visits with your health care provider that can promote health and wellness. This includes: A yearly physical exam. This is also called an annual wellness visit. Regular dental and eye exams. Immunizations. Screening for certain conditions. Healthy lifestyle choices, such as: Eating a healthy diet. Getting regular exercise. Not using drugs or products that contain nicotine and tobacco. Limiting alcohol use. What can I expect for my preventive care visit? Physical exam Your health care provider will check your: Height and weight. These may be used to calculate your BMI (body mass index). BMI is a measurement that tells if you are at a healthy weight. Heart rate and blood pressure. Body temperature. Skin for abnormal spots. Counseling Your health care provider may ask you questions about your: Past medical problems. Family's medical history. Alcohol, tobacco, and drug use. Emotional well-being. Home life and relationship well-being. Sexual activity. Diet, exercise, and sleep habits. History of falls. Memory and ability to understand (cognition). Work and work environment. Access to firearms. What immunizations do I need?  Vaccines are usually given at various ages, according to a schedule. Your health care provider will recommend vaccines for you based on your age, medicalhistory, and lifestyle or other factors, such as travel or where you work. What tests do I need? Blood tests Lipid and cholesterol levels. These may be checked every 5 years, or more often depending on your overall health. Hepatitis C test. Hepatitis B test. Screening Lung cancer screening. You may have this screening every year starting at age 55 if you have a 30-pack-year history of smoking and currently smoke or have quit within the past 15 years. Colorectal cancer screening. All adults should have this screening  starting at age 50 and continuing until age 75. Your health care provider may recommend screening at age 45 if you are at increased risk. You will have tests every 1-10 years, depending on your results and the type of screening test. Prostate cancer screening. Recommendations will vary depending on your family history and other risks. Genital exam to check for testicular cancer or hernias. Diabetes screening. This is done by checking your blood sugar (glucose) after you have not eaten for a while (fasting). You may have this done every 1-3 years. Abdominal aortic aneurysm (AAA) screening. You may need this if you are a current or former smoker. STD (sexually transmitted disease) testing, if you are at risk. Follow these instructions at home: Eating and drinking  Eat a diet that includes fresh fruits and vegetables, whole grains, lean protein, and low-fat dairy products. Limit your intake of foods with high amounts of sugar, saturated fats, and salt. Take vitamin and mineral supplements as recommended by your health care provider. Do not drink alcohol if your health care provider tells you not to drink. If you drink alcohol: Limit how much you have to 0-2 drinks a day. Be aware of how much alcohol is in your drink. In the [place] one drink equals one 12 oz bottle of beer (355 mL), one 5 oz glass of wine (148 mL), or one 1 oz glass of hard liquor (44 mL).  Lifestyle Take daily care of your teeth and gums. Brush your teeth every morning and night with fluoride toothpaste. Floss one time each day. Stay active. Exercise for at least 30 minutes 5 or more days each week. Do not use any products that contain nicotine or tobacco, such as cigarettes, e-cigarettes, and chewing tobacco.   If you need help quitting, ask your health care provider. Do not use drugs. If you are sexually active, practice safe sex. Use a condom or other form of protection to prevent STIs (sexually transmitted infections). Talk  with your health care provider about taking a low-dose aspirin or statin. Find healthy ways to cope with stress, such as: Meditation, yoga, or listening to music. Journaling. Talking to a trusted person. Spending time with friends and family. Safety Always wear your seat belt while driving or riding in a vehicle. Do not drive: If you have been drinking alcohol. Do not ride with someone who has been drinking. When you are tired or distracted. While texting. Wear a helmet and other protective equipment during sports activities. If you have firearms in your house, make sure you follow all gun safety procedures. What's next? Visit your health care provider once a year for an annual wellness visit. Ask your health care provider how often you should have your eyes and teeth checked. Stay up to date on all vaccines. This information is not intended to replace advice given to you by your health care provider. Make sure you discuss any questions you have with your healthcare provider. Document Revised: 02/06/2019 Document Reviewed: 05/04/2018 Elsevier Patient Education  2022 Elsevier Inc.  

## 2020-12-31 LAB — COMPLETE METABOLIC PANEL WITH GFR
AG Ratio: 1.4 (calc) (ref 1.0–2.5)
ALT: 25 U/L (ref 9–46)
AST: 41 U/L — ABNORMAL HIGH (ref 10–35)
Albumin: 4.2 g/dL (ref 3.6–5.1)
Alkaline phosphatase (APISO): 47 U/L (ref 35–144)
BUN: 21 mg/dL (ref 7–25)
CO2: 27 mmol/L (ref 20–32)
Calcium: 8.9 mg/dL (ref 8.6–10.3)
Chloride: 108 mmol/L (ref 98–110)
Creat: 1.09 mg/dL (ref 0.70–1.35)
Globulin: 3 g/dL (calc) (ref 1.9–3.7)
Glucose, Bld: 82 mg/dL (ref 65–99)
Potassium: 4.2 mmol/L (ref 3.5–5.3)
Sodium: 141 mmol/L (ref 135–146)
Total Bilirubin: 0.3 mg/dL (ref 0.2–1.2)
Total Protein: 7.2 g/dL (ref 6.1–8.1)
eGFR: 75 mL/min/{1.73_m2} (ref >=60)

## 2020-12-31 LAB — CBC WITH DIFFERENTIAL/PLATELET
Absolute Monocytes: 382 cells/uL (ref 200–950)
Basophils Absolute: 22 cells/uL (ref 0–200)
Basophils Relative: 0.6 %
Eosinophils Absolute: 22 cells/uL (ref 15–500)
Eosinophils Relative: 0.6 %
HCT: 42.7 % (ref 38.5–50.0)
Hemoglobin: 14.3 g/dL (ref 13.2–17.1)
Lymphs Abs: 1346 cells/uL (ref 850–3900)
MCH: 29.9 pg (ref 27.0–33.0)
MCHC: 33.5 g/dL (ref 32.0–36.0)
MCV: 89.3 fL (ref 80.0–100.0)
MPV: 10.1 fL (ref 7.5–12.5)
Monocytes Relative: 10.6 %
Neutro Abs: 1829 cells/uL (ref 1500–7800)
Neutrophils Relative %: 50.8 %
Platelets: 195 10*3/uL (ref 140–400)
RBC: 4.78 10*6/uL (ref 4.20–5.80)
RDW: 13.8 % (ref 11.0–15.0)
Total Lymphocyte: 37.4 %
WBC: 3.6 10*3/uL — ABNORMAL LOW (ref 3.8–10.8)

## 2020-12-31 LAB — LIPID PANEL
Cholesterol: 194 mg/dL (ref <200)
HDL: 61 mg/dL (ref >=40)
LDL Cholesterol (Calc): 119 mg/dL (calc) — ABNORMAL HIGH
Non-HDL Cholesterol (Calc): 133 mg/dL (calc) — ABNORMAL HIGH (ref <130)
Total CHOL/HDL Ratio: 3.2 (calc) (ref <5.0)
Triglycerides: 53 mg/dL (ref <150)

## 2020-12-31 LAB — HEMOGLOBIN A1C
Hgb A1c MFr Bld: 5.3 % of total Hgb (ref <5.7)
Mean Plasma Glucose: 105 mg/dL
eAG (mmol/L): 5.8 mmol/L

## 2020-12-31 LAB — PSA: PSA: 0.84 ng/mL (ref <=4.00)

## 2021-03-26 NOTE — Progress Notes (Signed)
Name: Walter Tyler   MRN: [id number]    DOB: 07-Apr-1956   Date:03/27/2021       Progress Note  Subjective  Chief Complaint  Hair Thinning  HPI  Hair: he was seen by Dr. Rutherford Nail years ago for the same problem and topical medication helped. No redness or inflammation, he would like a refill. He feels well otherwise, no pruritis or flakiness of scalp.   Patient Active Problem List   Diagnosis Date Noted   Chronic right hip pain 12/28/2019   Low back pain 04/10/2018   Intermittent knee pain 12/15/2015   History of fracture due to fall 03/07/2012    Past Surgical History:  Procedure Laterality Date   WRIST SURGERY Right 03/07/2012    Family History  Problem Relation Age of Onset   Diabetes Mother    Heart disease Mother    Diabetes Father     Social History   Tobacco Use   Smoking status: Never   Smokeless tobacco: Never  Substance Use Topics   Alcohol use: No     Current Outpatient Medications:    Multiple Vitamins-Minerals (MULTIVITAMIN ADULTS 50+ PO), Take by mouth., Disp: , Rfl:   No Known Allergies  I personally reviewed active problem list, medication list, allergies, family history, social history, health maintenance with the patient/caregiver today.   ROS  Ten systems reviewed and is negative except as mentioned in HPI   Objective  Vitals:   03/27/21 1021  BP: 120/74  Pulse: 63  Resp: 16  Temp: 98 F (36.7 C)  SpO2: 94%  Weight: 169 lb (76.7 kg)  Height: '5\' 10"'  (1.778 m)    Body mass index is 24.25 kg/m.  Physical Exam  Constitutional: Patient appears well-developed and well-nourished.  No distress.  HEENT: head atraumatic, normocephalic, pupils equal and reactive to light, neck supple Cardiovascular: Normal rate, regular rhythm and normal heart sounds.  No murmur heard. No BLE edema. Scalp: he has an area of hair loss , very short hair on top of scalp but towards left side, also small area on nuchal area  Pulmonary/Chest: Effort normal  and breath sounds normal. No respiratory distress. Abdominal: Soft.  There is no tenderness. Psychiatric: Patient has a normal mood and affect. behavior is normal. Judgment and thought content normal.   Recent Results (from the past 2160 hour(s))  Lipid panel     Status: Abnormal   Collection Time: 12/30/20  8:23 AM  Result Value Ref Range   Cholesterol 194 <200 mg/dL   HDL 61 > OR = 40 mg/dL   Triglycerides 53 <150 mg/dL   LDL Cholesterol (Calc) 119 (H) mg/dL (calc)    Comment: Reference range: <100 . Desirable range <100 mg/dL for primary prevention;   <70 mg/dL for patients with CHD or diabetic patients  with > or = 2 CHD risk factors. Marland Kitchen LDL-C is now calculated using the Martin-Hopkins  calculation, which is a validated novel method providing  better accuracy than the Friedewald equation in the  estimation of LDL-C.  Cresenciano Genre et al. Annamaria Helling. 6967;893(81): 2061-2068  (http://education.[facility].com/faq/FAQ164)    Total CHOL/HDL Ratio 3.2 <5.0 (calc)   Non-HDL Cholesterol (Calc) 133 (H) <130 mg/dL (calc)    Comment: For patients with diabetes plus 1 major ASCVD risk  factor, treating to a non-HDL-C goal of <100 mg/dL  (LDL-C of <70 mg/dL) is considered a therapeutic  option.   COMPLETE METABOLIC PANEL WITH GFR     Status: Abnormal   Collection Time: 12/30/20  8:23 AM  Result Value Ref Range   Glucose, Bld 82 65 - 99 mg/dL    Comment: .            Fasting reference interval .    BUN 21 7 - 25 mg/dL   Creat 1.09 0.70 - 1.35 mg/dL   eGFR 75 > OR = 60 mL/min/1.31m    Comment: The eGFR is based on the CKD-EPI 2021 equation. To calculate  the new eGFR from a previous Creatinine or Cystatin C result, go to https://www.kidney.org/professionals/ kdoqi/gfr%5Fcalculator    BUN/Creatinine Ratio NOT APPLICABLE 6 - 22 (calc)   Sodium 141 135 - 146 mmol/L   Potassium 4.2 3.5 - 5.3 mmol/L   Chloride 108 98 - 110 mmol/L   CO2 27 20 - 32 mmol/L   Calcium 8.9 8.6 - 10.3 mg/dL    Total Protein 7.2 6.1 - 8.1 g/dL   Albumin 4.2 3.6 - 5.1 g/dL   Globulin 3.0 1.9 - 3.7 g/dL (calc)   AG Ratio 1.4 1.0 - 2.5 (calc)   Total Bilirubin 0.3 0.2 - 1.2 mg/dL   Alkaline phosphatase (APISO) 47 35 - 144 U/L   AST 41 (H) 10 - 35 U/L   ALT 25 9 - 46 U/L  CBC with Differential/Platelet     Status: Abnormal   Collection Time: 12/30/20  8:23 AM  Result Value Ref Range   WBC 3.6 (L) 3.8 - 10.8 Thousand/uL   RBC 4.78 4.20 - 5.80 Million/uL   Hemoglobin 14.3 13.2 - 17.1 g/dL   HCT 42.7 38.5 - 50.0 %   MCV 89.3 80.0 - 100.0 fL   MCH 29.9 27.0 - 33.0 pg   MCHC 33.5 32.0 - 36.0 g/dL   RDW 13.8 11.0 - 15.0 %   Platelets 195 140 - 400 Thousand/uL   MPV 10.1 7.5 - 12.5 fL   Neutro Abs 1,829 1,500 - 7,800 cells/uL   Lymphs Abs 1,346 850 - 3,900 cells/uL   Absolute Monocytes 382 200 - 950 cells/uL   Eosinophils Absolute 22 15 - 500 cells/uL   Basophils Absolute 22 0 - 200 cells/uL   Neutrophils Relative % 50.8 %   Total Lymphocyte 37.4 %   Monocytes Relative 10.6 %   Eosinophils Relative 0.6 %   Basophils Relative 0.6 %  Hemoglobin A1c     Status: None   Collection Time: 12/30/20  8:23 AM  Result Value Ref Range   Hgb A1c MFr Bld 5.3 <5.7 % of total Hgb    Comment: For the purpose of screening for the presence of diabetes: . <5.7%       Consistent with the absence of diabetes 5.7-6.4%    Consistent with increased risk for diabetes             (prediabetes) > or =6.5%  Consistent with diabetes . This assay result is consistent with a decreased risk of diabetes. . Currently, no consensus exists regarding use of hemoglobin A1c for diagnosis of diabetes in children. . According to American Diabetes Association (ADA) guidelines, hemoglobin A1c <7.0% represents optimal control in non-pregnant diabetic patients. Different metrics may apply to specific patient populations.  Standards of Medical Care in Diabetes(ADA). .    Mean Plasma Glucose 105 mg/dL   eAG (mmol/L) 5.8  mmol/L  PSA     Status: None   Collection Time: 12/30/20  8:23 AM  Result Value Ref Range   PSA 0.84 < OR = 4.00 ng/mL    Comment: The  total PSA value from this assay system is  standardized against the WHO standard. The test  result will be approximately 20% lower when compared  to the equimolar-standardized total PSA (Beckman  Coulter). Comparison of serial PSA results should be  interpreted with this fact in mind. . This test was performed using the Siemens  chemiluminescent method. Values obtained from  different assay methods cannot be used interchangeably. PSA levels, regardless of value, should not be interpreted as absolute evidence of the presence or absence of disease.      PHQ2/9: Depression screen [facility] 2/9 03/27/2021 12/30/2020 12/28/2019 12/22/2018 12/19/2017  Decreased Interest 0 0 0 0 0  Down, Depressed, Hopeless 0 0 0 0 0  PHQ - 2 Score 0 0 0 0 0  Altered sleeping 0 - 0 0 0  Tired, decreased energy 0 - 0 0 0  Change in appetite 0 - 0 0 0  Feeling bad or failure about yourself  0 - 0 0 0  Trouble concentrating 0 - 0 0 0  Moving slowly or fidgety/restless 0 - 0 0 0  Suicidal thoughts 0 - 0 0 0  PHQ-9 Score 0 - 0 0 0  Difficult doing work/chores - - - - Not difficult at all    phq 9 is negative   Fall Risk: Fall Risk  03/27/2021 12/30/2020 12/28/2019 12/22/2018 12/19/2017  Falls in the past year? 0 0 0 0 No  Number falls in past yr: 0 0 0 0 -  Injury with Fall? 0 0 0 0 -  Risk for fall due to : No Fall Risks No Fall Risks - - -  Follow up Falls prevention discussed Falls prevention discussed - - -      Functional Status Survey: Is the patient deaf or have difficulty hearing?: No Does the patient have difficulty seeing, even when wearing glasses/contacts?: No Does the patient have difficulty concentrating, remembering, or making decisions?: No Does the patient have difficulty walking or climbing stairs?: No Does the patient have difficulty dressing or bathing?:  No Does the patient have difficulty doing errands alone such as visiting a doctor's office or shopping?: No    Assessment & Plan  1. Hair loss  - Fluocinolone Acetonide Scalp 0.01 % OIL; Apply 1 each topically 3 (three) times a week.  Dispense: 118.28 mL; Refill: 0

## 2021-03-27 ENCOUNTER — Encounter: Payer: Self-pay | Admitting: Family Medicine

## 2021-03-27 ENCOUNTER — Other Ambulatory Visit: Payer: Self-pay

## 2021-03-27 ENCOUNTER — Ambulatory Visit: Payer: BC Managed Care – PPO | Admitting: Family Medicine

## 2021-03-27 VITALS — BP 120/74 | HR 63 | Temp 98.0°F | Resp 16 | Ht 70.0 in | Wt 169.0 lb

## 2021-03-27 DIAGNOSIS — L659 Nonscarring hair loss, unspecified: Secondary | ICD-10-CM

## 2021-03-27 MED ORDER — FLUOCINOLONE ACETONIDE SCALP 0.01 % EX OIL: 1.0000 | TOPICAL_OIL | CUTANEOUS | 0 refills | Status: DC

## 2021-12-30 NOTE — Patient Instructions (Incomplete)
Preventive Care 65 Years and Older, Male Preventive care refers to lifestyle choices and visits with your health care provider that can promote health and wellness. Preventive care visits are also called wellness exams. What can I expect for my preventive care visit? Counseling During your preventive care visit, your health care provider may ask about your: Medical history, including: Past medical problems. Family medical history. History of falls. Current health, including: Emotional well-being. Home life and relationship well-being. Sexual activity. Memory and ability to understand (cognition). Lifestyle, including: Alcohol, nicotine or tobacco, and drug use. Access to firearms. Diet, exercise, and sleep habits. Work and work environment. Sunscreen use. Safety issues such as seatbelt and bike helmet use. Physical exam Your health care provider will check your: Height and weight. These may be used to calculate your BMI (body mass index). BMI is a measurement that tells if you are at a healthy weight. Waist circumference. This measures the distance around your waistline. This measurement also tells if you are at a healthy weight and may help predict your risk of certain diseases, such as type 2 diabetes and high blood pressure. Heart rate and blood pressure. Body temperature. Skin for abnormal spots. What immunizations do I need?  Vaccines are usually given at various ages, according to a schedule. Your health care provider will recommend vaccines for you based on your age, medical history, and lifestyle or other factors, such as travel or where you work. What tests do I need? Screening Your health care provider may recommend screening tests for certain conditions. This may include: Lipid and cholesterol levels. Diabetes screening. This is done by checking your blood sugar (glucose) after you have not eaten for a while (fasting). Hepatitis C test. Hepatitis B test. HIV (human  immunodeficiency virus) test. STI (sexually transmitted infection) testing, if you are at risk. Lung cancer screening. Colorectal cancer screening. Prostate cancer screening. Abdominal aortic aneurysm (AAA) screening. You may need this if you are a current or former smoker. Talk with your health care provider about your test results, treatment options, and if necessary, the need for more tests. Follow these instructions at home: Eating and drinking  Eat a diet that includes fresh fruits and vegetables, whole grains, lean protein, and low-fat dairy products. Limit your intake of foods with high amounts of sugar, saturated fats, and salt. Take vitamin and mineral supplements as recommended by your health care provider. Do not drink alcohol if your health care provider tells you not to drink. If you drink alcohol: Limit how much you have to 0-2 drinks a day. Know how much alcohol is in your drink. In the [place] one drink equals one 12 oz bottle of beer (355 mL), one 5 oz glass of wine (148 mL), or one 1 oz glass of hard liquor (44 mL). Lifestyle Brush your teeth every morning and night with fluoride toothpaste. Floss one time each day. Exercise for at least 30 minutes 5 or more days each week. Do not use any products that contain nicotine or tobacco. These products include cigarettes, chewing tobacco, and vaping devices, such as e-cigarettes. If you need help quitting, ask your health care provider. Do not use drugs. If you are sexually active, practice safe sex. Use a condom or other form of protection to prevent STIs. Take aspirin only as told by your health care provider. Make sure that you understand how much to take and what form to take. Work with your health care provider to find out whether it is safe   and beneficial for you to take aspirin daily. Ask your health care provider if you need to take a cholesterol-lowering medicine (statin). Find healthy ways to manage stress, such  as: Meditation, yoga, or listening to music. Journaling. Talking to a trusted person. Spending time with friends and family. Safety Always wear your seat belt while driving or riding in a vehicle. Do not drive: If you have been drinking alcohol. Do not ride with someone who has been drinking. When you are tired or distracted. While texting. If you have been using any mind-altering substances or drugs. Wear a helmet and other protective equipment during sports activities. If you have firearms in your house, make sure you follow all gun safety procedures. Minimize exposure to UV radiation to reduce your risk of skin cancer. What's next? Visit your health care provider once a year for an annual wellness visit. Ask your health care provider how often you should have your eyes and teeth checked. Stay up to date on all vaccines. This information is not intended to replace advice given to you by your health care provider. Make sure you discuss any questions you have with your health care provider. Document Revised: 11/05/2020 Document Reviewed: 11/05/2020 Elsevier Patient Education  2023 [employer].  

## 2021-12-30 NOTE — Progress Notes (Unsigned)
Name: Walter Tyler   MRN: [id number]    DOB: 08-24-1955   Date:12/30/2021       Progress Note  Subjective  Chief Complaint  Annual Exam  HPI  Patient presents for annual CPE.  IPSS Questionnaire (AUA-7): Over the past month.   1)  How often have you had a sensation of not emptying your bladder completely after you finish urinating?  {Rating:19227}  2)  How often have you had to urinate again less than two hours after you finished urinating? {Rating:19227}  3)  How often have you found you stopped and started again several times when you urinated?  {Rating:19227}  4) How difficult have you found it to postpone urination?  {Rating:19227}  5) How often have you had a weak urinary stream?  {Rating:19227}  6) How often have you had to push or strain to begin urination?  {Rating:19227}  7) How many times did you most typically get up to urinate from the time you went to bed until the time you got up in the morning?  {Rating:19228}  Total score:  0-7 mildly symptomatic   8-19 moderately symptomatic   20-35 severely symptomatic     Diet: *** Exercise: ***  Depression: phq 9 is {gen pos neg:315643}    03/27/2021   10:14 AM 12/30/2020    7:41 AM 12/28/2019    7:51 AM 12/22/2018    7:44 AM 12/19/2017    8:34 AM  Depression screen PHQ 2/9  Decreased Interest 0 0 0 0 0  Down, Depressed, Hopeless 0 0 0 0 0  PHQ - 2 Score 0 0 0 0 0  Altered sleeping 0  0 0 0  Tired, decreased energy 0  0 0 0  Change in appetite 0  0 0 0  Feeling bad or failure about yourself  0  0 0 0  Trouble concentrating 0  0 0 0  Moving slowly or fidgety/restless 0  0 0 0  Suicidal thoughts 0  0 0 0  PHQ-9 Score 0  0 0 0  Difficult doing work/chores     Not difficult at all    Hypertension:  BP Readings from Last 3 Encounters:  03/27/21 120/74  12/30/20 126/82  12/28/19 120/80    Obesity: Wt Readings from Last 3 Encounters:  03/27/21 169 lb (76.7 kg)  12/30/20 164 lb (74.4 kg)  12/28/19 163 lb 14.4 oz (74.3  kg)   BMI Readings from Last 3 Encounters:  03/27/21 24.25 kg/m  12/30/20 24.22 kg/m  12/28/19 24.03 kg/m     Lipids:  Lab Results  Component Value Date   CHOL 194 12/30/2020   CHOL 197 12/28/2019   CHOL 209 (H) 12/22/2018   Lab Results  Component Value Date   HDL 61 12/30/2020   HDL 55 12/28/2019   HDL 56 12/22/2018   Lab Results  Component Value Date   LDLCALC 119 (H) 12/30/2020   LDLCALC 127 (H) 12/28/2019   LDLCALC 139 (H) 12/22/2018   Lab Results  Component Value Date   TRIG 53 12/30/2020   TRIG 63 12/28/2019   TRIG 53 12/22/2018   Lab Results  Component Value Date   CHOLHDL 3.2 12/30/2020   CHOLHDL 3.6 12/28/2019   CHOLHDL 3.7 12/22/2018   No results found for: "LDLDIRECT" Glucose:  Glucose  Date Value Ref Range Status  04/27/2012 84 65 - 99 mg/dL Final   Glucose, Bld  Date Value Ref Range Status  12/30/2020 82 65 - 99 mg/dL  Final    Comment:    .            Fasting reference interval .   12/28/2019 83 65 - 99 mg/dL Final    Comment:    .            Fasting reference interval .   12/22/2018 86 65 - 99 mg/dL Final    Comment:    .            Fasting reference interval .     Flowsheet Row Office Visit from 12/30/2020 in [facility]  AUDIT-C Score 2      Married STD testing and prevention (HIV/chl/gon/syphilis): N/A Sexual history:  Hep C Screening: 12/15/15 Skin cancer: Discussed monitoring for atypical lesions Colorectal cancer: 02/04/20 Prostate cancer:   Lab Results  Component Value Date   PSA 0.84 12/30/2020   PSA 0.7 12/28/2019   PSA 0.9 12/22/2018     Lung cancer:  Low Dose CT Chest recommended if Age 37-80 years, 30 pack-year currently smoking OR have quit w/in 15years. Patient  no a candidate for screening   AAA: The USPSTF recommends one-time screening with ultrasonography in men ages 65 to 75 years who have ever smoked. Patient   no, a candidate for screening  ECG:  12/19/17  Vaccines:   HPV:  N/A Tdap: up to date Shingrix: up to date Pneumonia: up to date Flu: N/A COVID-19: up to date  Advanced Care Planning: A voluntary discussion about advance care planning including the explanation and discussion of advance directives.  Discussed health care proxy and Living will, and the patient was able to identify a health care proxy as wife.  Patient does not have a living will and power of attorney of health care   Patient Active Problem List   Diagnosis Date Noted   Chronic right hip pain 12/28/2019   Low back pain 04/10/2018   Intermittent knee pain 12/15/2015   History of fracture due to fall 03/07/2012    Past Surgical History:  Procedure Laterality Date   WRIST SURGERY Right 03/07/2012    Family History  Problem Relation Age of Onset   Diabetes Mother    Heart disease Mother    Diabetes Father     Social History   Socioeconomic History   Marital status: Married    Spouse name: Liborio Nixon   Number of children: 0   Years of education: Not on file   Highest education level: Bachelor's degree (e.g., BA, AB, BS)  Occupational History   Occupation: cycle counter   Tobacco Use   Smoking status: Never   Smokeless tobacco: Never  Vaping Use   Vaping Use: Never used  Substance and Sexual Activity   Alcohol use: No   Drug use: No   Sexual activity: Yes    Partners: Female    Birth control/protection: None  Other Topics Concern   Not on file  Social History Narrative   Not on file   Social Determinants of Health   Financial Resource Strain: Low Risk  (12/30/2020)   Overall Financial Resource Strain (CARDIA)    Difficulty of Paying Living Expenses: Not hard at all  Food Insecurity: No Food Insecurity (12/30/2020)   Hunger Vital Sign    Worried About Running Out of Food in the Last Year: Never true    Ran Out of Food in the Last Year: Never true  Transportation Needs: No Transportation Needs (12/30/2020)   PRAPARE - Transportation  Lack of Transportation (Medical):  No    Lack of Transportation (Non-Medical): No  Physical Activity: Sufficiently Active (12/30/2020)   Exercise Vital Sign    Days of Exercise per Week: 7 days    Minutes of Exercise per Session: 80 min  Stress: No Stress Concern Present (12/30/2020)   [employer] of Occupational Health - Occupational Stress Questionnaire    Feeling of Stress : Not at all  Social Connections: Socially Integrated (12/30/2020)   Social Connection and Isolation Panel [NHANES]    Frequency of Communication with Friends and Family: Twice a week    Frequency of Social Gatherings with Friends and Family: Three times a week    Attends Religious Services: More than 4 times per year    Active Member of Clubs or Organizations: Yes    Attends Banker Meetings: More than 4 times per year    Marital Status: Married  Catering manager Violence: Not At Risk (12/30/2020)   Humiliation, Afraid, Rape, and Kick questionnaire    Fear of Current or Ex-Partner: No    Emotionally Abused: No    Physically Abused: No    Sexually Abused: No     Current Outpatient Medications:    Fluocinolone Acetonide Scalp 0.01 % OIL, Apply 1 each topically 3 (three) times a week., Disp: 118.28 mL, Rfl: 0   Multiple Vitamins-Minerals (MULTIVITAMIN ADULTS 50+ PO), Take by mouth., Disp: , Rfl:   No Known Allergies   ROS  ***   Objective  There were no vitals filed for this visit.  There is no height or weight on file to calculate BMI.  Physical Exam ***  No results found for this or any previous visit (from the past 2160 hour(s)).   Fall Risk:    03/27/2021   10:14 AM 12/30/2020    7:40 AM 12/28/2019    7:51 AM 12/22/2018    7:44 AM 12/19/2017    8:34 AM  Fall Risk   Falls in the past year? 0 0 0 0 No  Number falls in past yr: 0 0 0 0   Injury with Fall? 0 0 0 0   Risk for fall due to : No Fall Risks No Fall Risks     Follow up Falls prevention discussed Falls prevention discussed        Functional Status  Survey:      Assessment & Plan  1. Well adult exam ***    -Prostate cancer screening and PSA options (with potential risks and benefits of testing vs not testing) were discussed along with recent recs/guidelines. -USPSTF grade A and B recommendations reviewed with patient; age-appropriate recommendations, preventive care, screening tests, etc discussed and encouraged; healthy living encouraged; see AVS for patient education given to patient -Discussed importance of 150 minutes of physical activity weekly, eat two servings of fish weekly, eat one serving of tree nuts ( cashews, pistachios, pecans, almonds.Marland Kitchen) every other day, eat 6 servings of fruit/vegetables daily and drink plenty of water and avoid sweet beverages.  -Reviewed Health Maintenance: yes

## 2021-12-31 ENCOUNTER — Encounter: Payer: Self-pay | Admitting: Family Medicine

## 2021-12-31 ENCOUNTER — Ambulatory Visit (INDEPENDENT_AMBULATORY_CARE_PROVIDER_SITE_OTHER): Payer: BC Managed Care – PPO | Admitting: Family Medicine

## 2021-12-31 VITALS — BP 112/70 | HR 58 | Resp 16 | Ht 70.0 in | Wt 163.0 lb

## 2021-12-31 DIAGNOSIS — Z131 Encounter for screening for diabetes mellitus: Secondary | ICD-10-CM | POA: Diagnosis not present

## 2021-12-31 DIAGNOSIS — Z0000 Encounter for general adult medical examination without abnormal findings: Secondary | ICD-10-CM

## 2021-12-31 DIAGNOSIS — Z13220 Encounter for screening for lipoid disorders: Secondary | ICD-10-CM | POA: Diagnosis not present

## 2021-12-31 DIAGNOSIS — N400 Enlarged prostate without lower urinary tract symptoms: Secondary | ICD-10-CM | POA: Diagnosis not present

## 2021-12-31 DIAGNOSIS — E785 Hyperlipidemia, unspecified: Secondary | ICD-10-CM | POA: Insufficient documentation

## 2021-12-31 DIAGNOSIS — N529 Male erectile dysfunction, unspecified: Secondary | ICD-10-CM

## 2021-12-31 DIAGNOSIS — L659 Nonscarring hair loss, unspecified: Secondary | ICD-10-CM

## 2021-12-31 MED ORDER — TADALAFIL 5 MG PO TABS: 5.0000 mg | ORAL_TABLET | Freq: Every day | ORAL | 3 refills | Status: DC

## 2022-01-01 LAB — HEMOGLOBIN A1C
Hgb A1c MFr Bld: 5.3 % of total Hgb (ref <5.7)
Mean Plasma Glucose: 105 mg/dL
eAG (mmol/L): 5.8 mmol/L

## 2022-01-01 LAB — COMPLETE METABOLIC PANEL WITH GFR
AG Ratio: 1.4 (calc) (ref 1.0–2.5)
ALT: 26 U/L (ref 9–46)
AST: 37 U/L — ABNORMAL HIGH (ref 10–35)
Albumin: 4.1 g/dL (ref 3.6–5.1)
Alkaline phosphatase (APISO): 47 U/L (ref 35–144)
BUN: 15 mg/dL (ref 7–25)
CO2: 28 mmol/L (ref 20–32)
Calcium: 8.7 mg/dL (ref 8.6–10.3)
Chloride: 106 mmol/L (ref 98–110)
Creat: 1.1 mg/dL (ref 0.70–1.35)
Globulin: 2.9 g/dL (calc) (ref 1.9–3.7)
Glucose, Bld: 77 mg/dL (ref 65–99)
Potassium: 4.1 mmol/L (ref 3.5–5.3)
Sodium: 142 mmol/L (ref 135–146)
Total Bilirubin: 0.3 mg/dL (ref 0.2–1.2)
Total Protein: 7 g/dL (ref 6.1–8.1)
eGFR: 74 mL/min/{1.73_m2} (ref >=60)

## 2022-01-01 LAB — LIPID PANEL
Cholesterol: 195 mg/dL (ref <200)
HDL: 53 mg/dL (ref >=40)
LDL Cholesterol (Calc): 127 mg/dL (calc) — ABNORMAL HIGH
Non-HDL Cholesterol (Calc): 142 mg/dL (calc) — ABNORMAL HIGH (ref <130)
Total CHOL/HDL Ratio: 3.7 (calc) (ref <5.0)
Triglycerides: 56 mg/dL (ref <150)

## 2022-01-01 LAB — CBC WITH DIFFERENTIAL/PLATELET
Absolute Monocytes: 266 cells/uL (ref 200–950)
Basophils Absolute: 21 cells/uL (ref 0–200)
Basophils Relative: 0.6 %
Eosinophils Absolute: 11 cells/uL — ABNORMAL LOW (ref 15–500)
Eosinophils Relative: 0.3 %
HCT: 41.5 % (ref 38.5–50.0)
Hemoglobin: 14.2 g/dL (ref 13.2–17.1)
Lymphs Abs: 1057 cells/uL (ref 850–3900)
MCH: 30.6 pg (ref 27.0–33.0)
MCHC: 34.2 g/dL (ref 32.0–36.0)
MCV: 89.4 fL (ref 80.0–100.0)
MPV: 9.9 fL (ref 7.5–12.5)
Monocytes Relative: 7.6 %
Neutro Abs: 2146 cells/uL (ref 1500–7800)
Neutrophils Relative %: 61.3 %
Platelets: 176 10*3/uL (ref 140–400)
RBC: 4.64 10*6/uL (ref 4.20–5.80)
RDW: 13.9 % (ref 11.0–15.0)
Total Lymphocyte: 30.2 %
WBC: 3.5 10*3/uL — ABNORMAL LOW (ref 3.8–10.8)

## 2022-01-01 LAB — PSA: PSA: 1.04 ng/mL (ref <=4.00)

## 2022-01-06 ENCOUNTER — Other Ambulatory Visit: Payer: Self-pay | Admitting: Family Medicine

## 2022-01-06 DIAGNOSIS — L648 Other androgenic alopecia: Secondary | ICD-10-CM | POA: Diagnosis not present

## 2022-01-06 DIAGNOSIS — L659 Nonscarring hair loss, unspecified: Secondary | ICD-10-CM

## 2022-07-09 DIAGNOSIS — L648 Other androgenic alopecia: Secondary | ICD-10-CM | POA: Diagnosis not present

## 2022-12-08 ENCOUNTER — Ambulatory Visit: Payer: BC Managed Care – PPO | Admitting: Urology

## 2022-12-16 ENCOUNTER — Emergency Department: Payer: PRIVATE HEALTH INSURANCE

## 2022-12-16 ENCOUNTER — Ambulatory Visit (INDEPENDENT_AMBULATORY_CARE_PROVIDER_SITE_OTHER)
Admission: EM | Admit: 2022-12-16 | Discharge: 2022-12-16 | Disposition: A | Discharge status: Other Acute Inpatient Hospital | Payer: PRIVATE HEALTH INSURANCE | Source: Home / Self Care

## 2022-12-16 ENCOUNTER — Encounter: Payer: Self-pay | Admitting: Emergency Medicine

## 2022-12-16 ENCOUNTER — Other Ambulatory Visit: Payer: Self-pay

## 2022-12-16 ENCOUNTER — Inpatient Hospital Stay: Payer: PRIVATE HEALTH INSURANCE

## 2022-12-16 ENCOUNTER — Ambulatory Visit (INDEPENDENT_AMBULATORY_CARE_PROVIDER_SITE_OTHER): Payer: PRIVATE HEALTH INSURANCE

## 2022-12-16 ENCOUNTER — Inpatient Hospital Stay
Admission: EM | Admit: 2022-12-16 | Discharge: 2022-12-25 | DRG: 329 | Disposition: A | Discharge status: Home Health | Payer: PRIVATE HEALTH INSURANCE | Attending: Surgery | Admitting: Surgery

## 2022-12-16 DIAGNOSIS — Z79899 Other long term (current) drug therapy: Secondary | ICD-10-CM | POA: Diagnosis not present

## 2022-12-16 DIAGNOSIS — N400 Enlarged prostate without lower urinary tract symptoms: Secondary | ICD-10-CM | POA: Diagnosis present

## 2022-12-16 DIAGNOSIS — K565 Intestinal adhesions [bands], unspecified as to partial versus complete obstruction: Principal | ICD-10-CM | POA: Diagnosis present

## 2022-12-16 DIAGNOSIS — Z8249 Family history of ischemic heart disease and other diseases of the circulatory system: Secondary | ICD-10-CM

## 2022-12-16 DIAGNOSIS — K56609 Unspecified intestinal obstruction, unspecified as to partial versus complete obstruction: Secondary | ICD-10-CM | POA: Diagnosis not present

## 2022-12-16 DIAGNOSIS — K567 Ileus, unspecified: Secondary | ICD-10-CM | POA: Diagnosis not present

## 2022-12-16 DIAGNOSIS — K650 Generalized (acute) peritonitis: Secondary | ICD-10-CM | POA: Diagnosis not present

## 2022-12-16 DIAGNOSIS — K9171 Accidental puncture and laceration of a digestive system organ or structure during a digestive system procedure: Secondary | ICD-10-CM | POA: Diagnosis not present

## 2022-12-16 DIAGNOSIS — K631 Perforation of intestine (nontraumatic): Secondary | ICD-10-CM | POA: Diagnosis not present

## 2022-12-16 DIAGNOSIS — Z833 Family history of diabetes mellitus: Secondary | ICD-10-CM

## 2022-12-16 DIAGNOSIS — R000 Tachycardia, unspecified: Secondary | ICD-10-CM | POA: Diagnosis not present

## 2022-12-16 DIAGNOSIS — Z8719 Personal history of other diseases of the digestive system: Secondary | ICD-10-CM | POA: Diagnosis present

## 2022-12-16 LAB — CBC
HCT: 42.7 % (ref 39.0–52.0)
Hemoglobin: 14.8 g/dL (ref 13.0–17.0)
MCH: 29.6 pg (ref 26.0–34.0)
MCHC: 34.7 g/dL (ref 30.0–36.0)
MCV: 85.4 fL (ref 80.0–100.0)
Platelets: 212 10*3/uL (ref 150–400)
RBC: 5 MIL/uL (ref 4.22–5.81)
RDW: 14.2 % (ref 11.5–15.5)
WBC: 5.4 10*3/uL (ref 4.0–10.5)
nRBC: 0 % (ref 0.0–0.2)

## 2022-12-16 LAB — URINALYSIS, W/ REFLEX TO CULTURE (INFECTION SUSPECTED)
Glucose, UA: 100 mg/dL — AB
Ketones, ur: 40 mg/dL — AB
Leukocytes,Ua: NEGATIVE
Nitrite: NEGATIVE
Protein, ur: >300 mg/dL — AB
RBC / HPF: >50 RBC/hpf (ref 0–5)
Specific Gravity, Urine: 1.025 (ref 1.005–1.030)
pH: 5.5 (ref 5.0–8.0)

## 2022-12-16 LAB — LIPASE, BLOOD: Lipase: 21 U/L (ref 11–51)

## 2022-12-16 LAB — COMPREHENSIVE METABOLIC PANEL
ALT: 31 U/L (ref 0–44)
AST: 36 U/L (ref 15–41)
Albumin: 3.5 g/dL (ref 3.5–5.0)
Alkaline Phosphatase: 46 U/L (ref 38–126)
Anion gap: 12 (ref 5–15)
BUN: 14 mg/dL (ref 8–23)
CO2: 23 mmol/L (ref 22–32)
Calcium: 8.7 mg/dL — ABNORMAL LOW (ref 8.9–10.3)
Chloride: 95 mmol/L — ABNORMAL LOW (ref 98–111)
Creatinine, Ser: 1.02 mg/dL (ref 0.61–1.24)
GFR, Estimated: >60 mL/min (ref >60)
Glucose, Bld: 115 mg/dL — ABNORMAL HIGH (ref 70–99)
Potassium: 3.9 mmol/L (ref 3.5–5.1)
Sodium: 130 mmol/L — ABNORMAL LOW (ref 135–145)
Total Bilirubin: 1.1 mg/dL (ref 0.3–1.2)
Total Protein: 8.2 g/dL — ABNORMAL HIGH (ref 6.5–8.1)

## 2022-12-16 LAB — URINALYSIS, ROUTINE W REFLEX MICROSCOPIC
Bacteria, UA: NONE SEEN
Glucose, UA: NEGATIVE mg/dL
Ketones, ur: 5 mg/dL — AB
Leukocytes,Ua: NEGATIVE
Nitrite: NEGATIVE
Protein, ur: 100 mg/dL — AB
Specific Gravity, Urine: 1.029 (ref 1.005–1.030)
pH: 5 (ref 5.0–8.0)

## 2022-12-16 MED ORDER — DOCUSATE SODIUM 100 MG PO CAPS: 100.0000 mg | ORAL_CAPSULE | Freq: Two times a day (BID) | ORAL | Status: DC | PRN

## 2022-12-16 MED ORDER — SODIUM CHLORIDE 0.9 % IV SOLN: INTRAVENOUS | Status: DC

## 2022-12-16 MED ORDER — ENOXAPARIN SODIUM 40 MG/0.4ML IJ SOSY
40.0000 mg | PREFILLED_SYRINGE | INTRAMUSCULAR | Status: DC
  Administered 2022-12-17 – 2022-12-25 (×9): 40 mg via SUBCUTANEOUS
  Filled 2022-12-16 (×9): qty 0.4

## 2022-12-16 MED ORDER — SODIUM CHLORIDE 0.9 % IV BOLUS
1000.0000 mL | Freq: Once | INTRAVENOUS | Status: AC
  Administered 2022-12-16: 1000 mL via INTRAVENOUS

## 2022-12-16 MED ORDER — TRAMADOL HCL 50 MG PO TABS
50.0000 mg | ORAL_TABLET | Freq: Four times a day (QID) | ORAL | Status: DC | PRN
  Administered 2022-12-19 – 2022-12-20 (×2): 50 mg via ORAL
  Filled 2022-12-16 (×3): qty 1

## 2022-12-16 MED ORDER — MORPHINE SULFATE (PF) 2 MG/ML IV SOLN
2.0000 mg | INTRAVENOUS | Status: DC | PRN
  Administered 2022-12-16 – 2022-12-18 (×4): 2 mg via INTRAVENOUS
  Filled 2022-12-16 (×4): qty 1

## 2022-12-16 MED ORDER — MORPHINE SULFATE (PF) 4 MG/ML IV SOLN
4.0000 mg | Freq: Once | INTRAVENOUS | Status: AC
  Administered 2022-12-16: 4 mg via INTRAVENOUS
  Filled 2022-12-16: qty 1

## 2022-12-16 MED ORDER — DIATRIZOATE MEGLUMINE & SODIUM 66-10 % PO SOLN
90.0000 mL | Freq: Once | ORAL | Status: AC
  Administered 2022-12-16: 90 mL via NASOGASTRIC

## 2022-12-16 MED ORDER — HYDROCODONE-ACETAMINOPHEN 5-325 MG PO TABS
1.0000 | ORAL_TABLET | ORAL | Status: DC | PRN
  Administered 2022-12-17: 2 via ORAL
  Filled 2022-12-16: qty 2

## 2022-12-16 MED ORDER — ONDANSETRON HCL 4 MG/2ML IJ SOLN: 4.0000 mg | Freq: Four times a day (QID) | INTRAMUSCULAR | Status: DC | PRN

## 2022-12-16 MED ORDER — IOHEXOL 300 MG/ML  SOLN
100.0000 mL | Freq: Once | INTRAMUSCULAR | Status: AC | PRN
  Administered 2022-12-16: 100 mL via INTRAVENOUS

## 2022-12-16 MED ORDER — PANTOPRAZOLE SODIUM 40 MG IV SOLR
40.0000 mg | Freq: Every day | INTRAVENOUS | Status: DC
  Administered 2022-12-16 – 2022-12-18 (×3): 40 mg via INTRAVENOUS
  Filled 2022-12-16 (×3): qty 10

## 2022-12-16 MED ORDER — ONDANSETRON HCL 4 MG/2ML IJ SOLN
4.0000 mg | Freq: Once | INTRAMUSCULAR | Status: AC
  Administered 2022-12-16: 4 mg via INTRAVENOUS
  Filled 2022-12-16: qty 2

## 2022-12-16 MED ORDER — ONDANSETRON 4 MG PO TBDP: 4.0000 mg | ORAL_TABLET | Freq: Four times a day (QID) | ORAL | Status: DC | PRN

## 2022-12-16 NOTE — ED Notes (Signed)
Patient is being discharged from the Urgent Care and sent to the Emergency Department via personal vehicle . Per Tamela Oddi NP, patient is in need of higher level of care due to bowel obstruction. Patient is aware and verbalizes understanding of plan of care.  Vitals:   12/16/22 0939  BP: 134/83  Pulse: 91  Resp: 18  Temp: 99.1 F (37.3 C)  SpO2: 94%

## 2022-12-16 NOTE — ED Notes (Signed)
Patient's wife would like to speak to Surgeon or MD prior to NG placement.  Would like clarification prior to intervention.

## 2022-12-16 NOTE — H&P (Signed)
Subjective:   CC: SBO  HPI:  Walter Tyler is a 67 y.o. male who was consulted by [facility] for issue above.  Symptoms were first noted several days ago. Pain is sharp, abdominal.  Associated with N/V, exacerbated by nothing specific     Past Medical History:  has a past medical history of BPH (benign prostatic hyperplasia) and Shoulder pain, acute.  Past Surgical History:  Past Surgical History:  Procedure Laterality Date   WRIST SURGERY Right 03/07/2012    Family History: family history includes Diabetes in his father and mother; Heart disease in his mother.  Social History:  reports that he has never smoked. He has never used smokeless tobacco. He reports current alcohol use of about 2.0 standard drinks of alcohol per week. He reports that he does not use drugs.  Current Medications:  Prior to Admission medications   Medication Sig Start Date End Date Taking? Authorizing Provider  Fluocinolone Acetonide Scalp 0.01 % OIL APPLY 1 EACH TOPICALLY 3 (THREE) TIMES A WEEK. 01/06/22   Alba Cory, MD  Multiple Vitamins-Minerals (MULTIVITAMIN ADULTS 50+ PO) Take by mouth.    [provider]  tadalafil (CIALIS) 5 MG tablet Take 1 tablet (5 mg total) by mouth daily. 12/31/21   Alba Cory, MD    Allergies:  Allergies as of 12/16/2022   (No Known Allergies)    ROS:  General: Denies weight loss, weight gain, fatigue, fevers, chills, and night sweats. Eyes: Denies blurry vision, double vision, eye pain, itchy eyes, and tearing. Ears: Denies hearing loss, earache, and ringing in ears. Nose: Denies sinus pain, congestion, infections, runny nose, and nosebleeds. Mouth/throat: Denies hoarseness, sore throat, bleeding gums, and difficulty swallowing. Heart: Denies chest pain, palpitations, racing heart, irregular heartbeat, leg pain or swelling, and decreased activity tolerance. Respiratory: Denies breathing difficulty, shortness of breath, wheezing, cough, and sputum. GI:  Denies change in appetite, heartburn, nausea, vomiting, constipation, diarrhea, and blood in stool. GU: Denies difficulty urinating, pain with urinating, urgency, frequency, blood in urine. Musculoskeletal: Denies joint stiffness, pain, swelling, muscle weakness. Skin: Denies rash, itching, mass, tumors, sores, and boils Neurologic: Denies headache, fainting, dizziness, seizures, numbness, and tingling. Psychiatric: Denies depression, anxiety, difficulty sleeping, and memory loss. Endocrine: Denies heat or cold intolerance, and increased thirst or urination. Blood/lymph: Denies easy bruising, easy bruising, and swollen glands     Objective:     BP (!) 159/80 (BP Location: Left Arm)   Pulse 82   Temp 99.6 F (37.6 C) (Oral)   Resp 17   Ht 5\' 10"  (1.778 m)   Wt 77.1 kg   SpO2 99%   BMI 24.39 kg/m   Constitutional :  alert, cooperative, appears stated age, and no distress  Lymphatics/Throat:  no asymmetry, masses, or scars  Respiratory:  clear to auscultation bilaterally  Cardiovascular:  regular rate and rhythm  Gastrointestinal: Soft, distended, TTP .   Musculoskeletal: Steady movement  Skin: Cool and moist   Psychiatric: Normal affect, non-agitated, not confused       LABS:     Latest Ref Rng & Units 12/16/2022    1:40 PM 12/31/2021    8:22 AM 12/30/2020    8:23 AM  CMP  Glucose 70 - 99 mg/dL 387  77  82   BUN 8 - 23 mg/dL 14  15  21    Creatinine 0.61 - 1.24 mg/dL 5.64  3.32  9.51   Sodium 135 - 145 mmol/L 130  142  141   Potassium 3.5 -  5.1 mmol/L 3.9  4.1  4.2   Chloride 98 - 111 mmol/L 95  106  108   CO2 22 - 32 mmol/L 23  28  27    Calcium 8.9 - 10.3 mg/dL 8.7  8.7  8.9   Total Protein 6.5 - 8.1 g/dL 8.2  7.0  7.2   Total Bilirubin 0.3 - 1.2 mg/dL 1.1  0.3  0.3   Alkaline Phos 38 - 126 U/L 46     AST 15 - 41 U/L 36  37  41   ALT 0 - 44 U/L 31  26  25        Latest Ref Rng & Units 12/16/2022    1:40 PM 12/31/2021    8:22 AM 12/30/2020    8:23 AM  CBC  WBC 4.0 -  10.5 K/uL 5.4  3.5  3.6   Hemoglobin 13.0 - 17.0 g/dL 09.8  11.9  14.7   Hematocrit 39.0 - 52.0 % 42.7  41.5  42.7   Platelets 150 - 400 K/uL 212  176  195     RADS:CLINICAL DATA:  Abdominal pain and bloating.  Abnormal radiograph   EXAM: CT ABDOMEN AND PELVIS WITH CONTRAST   TECHNIQUE: Multidetector CT imaging of the abdomen and pelvis was performed using the standard protocol following bolus administration of intravenous contrast.   RADIATION DOSE REDUCTION: This exam was performed according to the departmental dose-optimization program which includes automated exposure control, adjustment of the mA and/or kV according to patient size and/or use of iterative reconstruction technique.   CONTRAST:  OMNIPAQUE IOHEXOL 300 MG/ML  SOLN   COMPARISON:  Radiograph same day   FINDINGS: Lower chest: Lung bases are clear.   Hepatobiliary: Benign cysts in the RIGHT hepatic lobe. No biliary duct dilatation. Gallbladder normal.   Pancreas: Pancreas is normal. No ductal dilatation. No pancreatic inflammation.   Spleen: Normal spleen   Adrenals/urinary tract: Adrenal glands and kidneys are normal. The ureters and bladder normal.   Stomach/Bowel: Stomach, small bowel, appendix, and cecum are normal. The colon and rectosigmoid colon are normal.   Vascular/Lymphatic: Stomach is normal. Small amount of fluid the stomach. Duodenum normal. Proximal small bowel is dilated up to 4 cm. Small bowel dilatation extends into the mid and distal small bowel with multiple loops of distended small bowel. Air-fluid levels within the small bowel. Small bowel measures 4 cm in the RIGHT abdomen. The most distal small bowel is collapsed leading up to the cecum. There is small bowel bowel wall thickening and submucosal edema within a segment of nondilated small bowel (image 70/2)a   No pneumatosis or portal venous gas identified.   The colon is from the collapsed. Diverticula of the sigmoid  colon. And descending colon.   Small intraperitoneal free fluid.   Reproductive: Prostate unremarkable.   Other: No free fluid.   Musculoskeletal: No aggressive osseous lesion.   IMPRESSION: 1. High-grade small bowel obstruction. Recommend surgical consultation. 2. No obstructing lesion not identified. 3. Inflammation and bowel wall edema in the distal small bowel. Correlate with any history of inflammatory bowel disease. 4. No portal venous gas or pneumatosis intestinalis.  Assessment:   SBO  Plan:   NG, SBFT, NPO, IVF for now.  The patient verbalized understanding and all questions were answered to the patient's satisfaction.  labs/images/medications/previous chart entries reviewed personally and relevant changes/updates noted above.

## 2022-12-16 NOTE — ED Notes (Signed)
ED TO INPATIENT HANDOFF REPORT  ED Nurse Name and Phone #: Walter Holst RN [phone]  S Name/Age/Gender Walter Tyler 67 y.o. male Room/Bed: ED16A/ED16A  Code Status   Code Status: Full Code  Home/SNF/Other Home Patient oriented to: self, place, time, and situation Is this baseline? Yes   Triage Complete: Triage complete  Chief Complaint SBO (small bowel obstruction) (HCC) [K56.609]  Triage Note Pt sts that he was at the [facility]. Per the UC, pt has been dx with a SBO and advised to come to the ED.   Allergies No Known Allergies  Level of Care/Admitting Diagnosis ED Disposition     ED Disposition  Admit   Condition  --   Comment  Hospital Area: [facility] [[id number]]  Level of Care: Med-Surg [16]  Covid Evaluation: Asymptomatic - no recent exposure (last 10 days) testing not required  Diagnosis: SBO (small bowel obstruction) (HCC) [454098]  Admitting Physician: Sung Amabile [[id number]]  Attending Physician: Sung Amabile [phone]  Certification:: I certify this patient will need inpatient services for at least 2 midnights  Estimated Length of Stay: 4          B Medical/Surgery History Past Medical History:  Diagnosis Date   BPH (benign prostatic hyperplasia)    Shoulder pain, acute    Past Surgical History:  Procedure Laterality Date   WRIST SURGERY Right 03/07/2012     A IV Location/Drains/Wounds Patient Lines/Drains/Airways Status     Active Line/Drains/Airways     Name Placement date Placement time Site Days   Peripheral IV 12/16/22 20 G Left Antecubital 12/16/22  1618  Antecubital  less than 1            Intake/Output Last 24 hours  Intake/Output Summary (Last 24 hours) at 12/16/2022 2040 Last data filed at 12/16/2022 1957 Gross per 24 hour  Intake 1000 ml  Output --  Net 1000 ml    Labs/Imaging Results for orders placed or performed during the hospital encounter of 12/16/22 (from the past 48 hour(s))  Lipase,  blood     Status: None   Collection Time: 12/16/22  1:40 PM  Result Value Ref Range   Lipase 21 11 - 51 U/L    Comment: Performed at [facility], [address] [place], [place] [address]  Comprehensive metabolic panel     Status: Abnormal   Collection Time: 12/16/22  1:40 PM  Result Value Ref Range   Sodium 130 (L) 135 - 145 mmol/L   Potassium 3.9 3.5 - 5.1 mmol/L   Chloride 95 (L) 98 - 111 mmol/L   CO2 23 22 - 32 mmol/L   Glucose, Bld 115 (H) 70 - 99 mg/dL    Comment: Glucose reference range applies only to samples taken after fasting for at least 8 hours.   BUN 14 8 - 23 mg/dL   Creatinine, Ser 8.46 0.61 - 1.24 mg/dL   Calcium 8.7 (L) 8.9 - 10.3 mg/dL   Total Protein 8.2 (H) 6.5 - 8.1 g/dL   Albumin 3.5 3.5 - 5.0 g/dL   AST 36 15 - 41 U/L   ALT 31 0 - 44 U/L   Alkaline Phosphatase 46 38 - 126 U/L   Total Bilirubin 1.1 0.3 - 1.2 mg/dL   GFR, Estimated >96 >29 mL/min    Comment: (NOTE) Calculated using the CKD-EPI Creatinine Equation (2021)    Anion gap 12 5 - 15    Comment: Performed at [facility], [address] [place], [place] [address]  CBC     Status: None   Collection Time: 12/16/22  1:40 PM  Result Value Ref Range   WBC 5.4 4.0 - 10.5 K/uL   RBC 5.00 4.22 - 5.81 MIL/uL   Hemoglobin 14.8 13.0 - 17.0 g/dL   HCT 62.1 30.8 - 65.7 %   MCV 85.4 80.0 - 100.0 fL   MCH 29.6 26.0 - 34.0 pg   MCHC 34.7 30.0 - 36.0 g/dL   RDW 84.6 96.2 - 95.2 %   Platelets 212 150 - 400 K/uL   nRBC 0.0 0.0 - 0.2 %    Comment: Performed at [facility], [address] [place], [place] [address]  Urinalysis, Routine w reflex microscopic -Urine, Clean Catch     Status: Abnormal   Collection Time: 12/16/22  3:55 PM  Result Value Ref Range   Color, Urine AMBER (A) YELLOW    Comment: BIOCHEMICALS MAY BE AFFECTED BY COLOR   APPearance HAZY (A) CLEAR   Specific Gravity, Urine 1.029 1.005 - 1.030   pH 5.0 5.0 - 8.0   Glucose, UA NEGATIVE NEGATIVE mg/dL   Hgb  urine dipstick SMALL (A) NEGATIVE   Bilirubin Urine SMALL (A) NEGATIVE   Ketones, ur 5 (A) NEGATIVE mg/dL   Protein, ur 440 (A) NEGATIVE mg/dL   Nitrite NEGATIVE NEGATIVE   Leukocytes,Ua NEGATIVE NEGATIVE   RBC / HPF 11-20 0 - 5 RBC/hpf   WBC, UA 0-5 0 - 5 WBC/hpf   Bacteria, UA NONE SEEN NONE SEEN   Squamous Epithelial / HPF 0-5 0 - 5 /HPF   Mucus PRESENT     Comment: Performed at [facility], [address], [place], [place] [address]   CT ABDOMEN PELVIS W CONTRAST  Result Date: 12/16/2022 CLINICAL DATA:  Abdominal pain and bloating.  Abnormal radiograph EXAM: CT ABDOMEN AND PELVIS WITH CONTRAST TECHNIQUE: Multidetector CT imaging of the abdomen and pelvis was performed using the standard protocol following bolus administration of intravenous contrast. RADIATION DOSE REDUCTION: This exam was performed according to the departmental dose-optimization program which includes automated exposure control, adjustment of the mA and/or kV according to patient size and/or use of iterative reconstruction technique. CONTRAST:  OMNIPAQUE IOHEXOL 300 MG/ML  SOLN COMPARISON:  Radiograph same day FINDINGS: Lower chest: Lung bases are clear. Hepatobiliary: Benign cysts in the RIGHT hepatic lobe. No biliary duct dilatation. Gallbladder normal. Pancreas: Pancreas is normal. No ductal dilatation. No pancreatic inflammation. Spleen: Normal spleen Adrenals/urinary tract: Adrenal glands and kidneys are normal. The ureters and bladder normal. Stomach/Bowel: Stomach, small bowel, appendix, and cecum are normal. The colon and rectosigmoid colon are normal. Vascular/Lymphatic: Stomach is normal. Small amount of fluid the stomach. Duodenum normal. Proximal small bowel is dilated up to 4 cm. Small bowel dilatation extends into the mid and distal small bowel with multiple loops of distended small bowel. Air-fluid levels within the small bowel. Small bowel measures 4 cm in the RIGHT abdomen. The most distal small  bowel is collapsed leading up to the cecum. There is small bowel bowel wall thickening and submucosal edema within a segment of nondilated small bowel (image 70/2)a No pneumatosis or portal venous gas identified. The colon is from the collapsed. Diverticula of the sigmoid colon. And descending colon. Small intraperitoneal free fluid. Reproductive: Prostate unremarkable. Other: No free fluid. Musculoskeletal: No aggressive osseous lesion. IMPRESSION: 1. High-grade small bowel obstruction. Recommend surgical consultation. 2. No obstructing lesion not identified. 3. Inflammation and bowel wall edema  in the distal small bowel. Correlate with any history of inflammatory bowel disease. 4. No portal venous gas or pneumatosis intestinalis. Electronically Signed   By: Genevive Bi M.D.   On: 12/16/2022 17:11   DG Abdomen Acute W/Chest  Result Date: 12/16/2022 CLINICAL DATA:  Acute abdominal pain and bloating. EXAM: DG ABDOMEN ACUTE WITH 1 VIEW CHEST COMPARISON:  None Available. FINDINGS: Severely dilated small bowel loops are noted with air-fluid level. No colonic dilatation is noted. No radiopaque calculi or other significant radiographic abnormality is seen. Heart size and mediastinal contours are within normal limits. Both lungs are clear. IMPRESSION: Severe small bowel dilatation is noted with air-fluid levels concerning for distal small bowel obstruction. No acute cardiopulmonary disease. Electronically Signed   By: Lupita Raider M.D.   On: 12/16/2022 12:17    Pending Labs Unresulted Labs (From admission, onward)     Start     Ordered   12/23/22 0500  Creatinine, serum  (enoxaparin (LOVENOX)    CrCl >/= 30 ml/min)  Weekly,   STAT     Comments: while on enoxaparin therapy    12/16/22 1959   12/16/22 2000  HIV Antibody (routine testing w rflx)  (HIV Antibody (Routine testing w reflex) panel)  Once,   R        12/16/22 1959            Vitals/Pain Today's Vitals   12/16/22 1846 12/16/22 1930  12/16/22 2038 12/16/22 2039  BP:  106/73 117/88   Pulse:  79 (!) 112 (!) 113  Resp:  18 20 (!) 24  Temp: 99.2 F (37.3 C)     TempSrc: Oral     SpO2:  99% 96% 96%  Weight:      Height:      PainSc:        Isolation Precautions No active isolations  Medications Medications  traMADol (ULTRAM) tablet 50 mg (has no administration in time range)  HYDROcodone-acetaminophen (NORCO/VICODIN) 5-325 MG per tablet 1-2 tablet (has no administration in time range)  morphine (PF) 2 MG/ML injection 2 mg (has no administration in time range)  docusate sodium (COLACE) capsule 100 mg (has no administration in time range)  ondansetron (ZOFRAN-ODT) disintegrating tablet 4 mg (has no administration in time range)    Or  ondansetron (ZOFRAN) injection 4 mg (has no administration in time range)  diatrizoate meglumine-sodium (GASTROGRAFIN) 66-10 % solution 90 mL (has no administration in time range)  enoxaparin (LOVENOX) injection 40 mg (has no administration in time range)  pantoprazole (PROTONIX) injection 40 mg (has no administration in time range)  0.9 %  sodium chloride infusion (has no administration in time range)  sodium chloride 0.9 % bolus 1,000 mL (0 mLs Intravenous Stopped 12/16/22 1957)  ondansetron (ZOFRAN) injection 4 mg (4 mg Intravenous Given 12/16/22 1639)  morphine (PF) 4 MG/ML injection 4 mg (4 mg Intravenous Given 12/16/22 1638)  iohexol (OMNIPAQUE) 300 MG/ML solution 100 mL (100 mLs Intravenous Contrast Given 12/16/22 1603)    Mobility walks     Focused Assessments Patient reports intermittent abdominal pain.  No current vomiting   R Recommendations: See Admitting Provider Note  Report given to:   Additional Notes: .

## 2022-12-16 NOTE — Discharge Instructions (Signed)
X-ray confirms that there is a blockage within your intestines inside your packet is more information on this condition  Please go to the nearest emergency department for further evaluation and management  Urinalysis did show blood however it does not show infection, at this time this is not a concern, may be addressed while at hospital

## 2022-12-16 NOTE — ED Provider Notes (Signed)
[facility]    CSN: [id number] Arrival date & time: 12/16/22  5366      History   Chief Complaint Chief Complaint  Patient presents with   Abdominal Pain    HPI Walter Tyler is a 67 y.o. male.   Patient presents for evaluation of constant centralized abdominal pain beginning 5 days ago.  Symptoms persisting but not worsening.  Does not radiate.  Feels as if abdomen is being torn apart.  Associated bloating , intermittent gas production .  Has been experiencing hard stools requiring straining, last bowel movement 1 day ago, denies presence of blood.  Has had a decreased appetite but tolerating food and liquid.  1 occurrence of vomiting this morning after consumption of a protein shake.  Has attempted use of suppository, last dosage 1 day ago to Tylenol .  Has all organs, denies prior abdominal surgery.  Active at baseline, avid runner.  Denies urinary symptoms, fever, URI symptoms      Past Medical History:  Diagnosis Date   BPH (benign prostatic hyperplasia)    Shoulder pain, acute     Patient Active Problem List   Diagnosis Date Noted   Benign prostatic hyperplasia without lower urinary tract symptoms 12/31/2021   History of fracture due to fall 03/07/2012    Past Surgical History:  Procedure Laterality Date   WRIST SURGERY Right 03/07/2012       Home Medications    Prior to Admission medications   Medication Sig Start Date End Date Taking? Authorizing Provider  Fluocinolone Acetonide Scalp 0.01 % OIL APPLY 1 EACH TOPICALLY 3 (THREE) TIMES A WEEK. 01/06/22   Alba Cory, MD  Multiple Vitamins-Minerals (MULTIVITAMIN ADULTS 50+ PO) Take by mouth.    [provider]  tadalafil (CIALIS) 5 MG tablet Take 1 tablet (5 mg total) by mouth daily. 12/31/21   Alba Cory, MD    Family History Family History  Problem Relation Age of Onset   Diabetes Mother    Heart disease Mother    Diabetes Father     Social History Social History    Tobacco Use   Smoking status: Never   Smokeless tobacco: Never  Vaping Use   Vaping status: Never Used  Substance Use Topics   Alcohol use: Yes    Alcohol/week: 2.0 standard drinks of alcohol    Types: 2 Shots of liquor per week   Drug use: No     Allergies   Patient has no known allergies.   Review of Systems Review of Systems  Gastrointestinal:  Positive for abdominal distention, abdominal pain and vomiting. Negative for anal bleeding, blood in stool, constipation, diarrhea, nausea and rectal pain.     Physical Exam Triage Vital Signs ED Triage Vitals  Encounter Vitals Group     BP 12/16/22 0939 134/83     Systolic BP Percentile --      Diastolic BP Percentile --      Pulse Rate 12/16/22 0939 91     Resp 12/16/22 0939 18     Temp 12/16/22 0939 99.1 F (37.3 C)     Temp Source 12/16/22 0939 Oral     SpO2 12/16/22 0939 94 %     Weight --      Height --      Head Circumference --      Peak Flow --      Pain Score 12/16/22 0938 6     Pain Loc --      Pain Education --  Exclude from Growth Chart --    No data found.  Updated Vital Signs BP 134/83 (BP Location: Right Arm)   Pulse 91   Temp 99.1 F (37.3 C) (Oral)   Resp 18   SpO2 94%   Visual Acuity Right Eye Distance:   Left Eye Distance:   Bilateral Distance:    Right Eye Near:   Left Eye Near:    Bilateral Near:     Physical Exam Constitutional:      Appearance: Normal appearance.  Eyes:     Extraocular Movements: Extraocular movements intact.  Pulmonary:     Effort: Pulmonary effort is normal.  Abdominal:     General: Bowel sounds are normal. There is distension.     Tenderness: There is abdominal tenderness in the periumbilical area.     Comments: Taut   Neurological:     Mental Status: He is alert and oriented to person, place, and time. Mental status is at baseline.      UC Treatments / Results  Labs (all labs ordered are listed, but only abnormal results are  displayed) Labs Reviewed - No data to display  EKG   Radiology No results found.  Procedures Procedures (including critical care time)  Medications Ordered in UC Medications - No data to display  Initial Impression / Assessment and Plan / UC Course  I have reviewed the triage vital signs and the nursing notes.  Pertinent labs & imaging results that were available during my care of the patient were reviewed by me and considered in my medical decision making (see chart for details).  Small bowel obstruction  Presentation is consistent with small bowel obstruction as there is tenderness to the periumbilical region, abdomen is distended and taut, patient at this time is in no signs of distress nontoxic-appearing and vital signs are stable, abdominal imaging obtained, confirming above, wife to escort patient to the nearest emergency department per personal vehicle, advised he may have water, no food   Final Clinical Impressions(s) / UC Diagnoses   Final diagnoses:  None   Discharge Instructions   None    ED Prescriptions   None    PDMP not reviewed this encounter.   Valinda Hoar, NP 12/16/22 1230

## 2022-12-16 NOTE — ED Provider Notes (Signed)
[facility] Provider Note    Event Date/Time   First MD Initiated Contact with Patient 12/16/22 1502     (approximate)  History   Chief Complaint: GI Problem  HPI  Walter Tyler is a 67 y.o. male with a past medical history of BPH who presents to the emergency department for bowel obstruction.  According to the patient for the past 5 days he has been experiencing nausea and abdominal discomfort/distention.  States he has been constipated over the last several days as well and today began having vomiting in addition to the nausea.  Patient denies any fever.  Denies any prior abdominal surgeries.  Patient went to urgent care today had an x-ray concerning for bowel obstruction and the patient was sent to the emergency department for further evaluation.  Physical Exam   Triage Vital Signs: ED Triage Vitals  Encounter Vitals Group     BP 12/16/22 1335 (!) 159/80     Systolic BP Percentile --      Diastolic BP Percentile --      Pulse Rate 12/16/22 1335 82     Resp 12/16/22 1335 17     Temp 12/16/22 1335 99.6 F (37.6 C)     Temp Source 12/16/22 1335 Oral     SpO2 12/16/22 1335 99 %     Weight 12/16/22 1336 170 lb (77.1 kg)     Height 12/16/22 1336 5\' 10"  (1.778 m)     Head Circumference --      Peak Flow --      Pain Score 12/16/22 1336 7     Pain Loc --      Pain Education --      Exclude from Growth Chart --     Most recent vital signs: Vitals:   12/16/22 1335  BP: (!) 159/80  Pulse: 82  Resp: 17  Temp: 99.6 F (37.6 C)  SpO2: 99%    General: Awake, no distress.  CV:  Good peripheral perfusion.  Regular rate and rhythm  Resp:  Normal effort.  Equal breath sounds bilaterally.  Abd:  No distention.  Moderate distention mild diffuse tenderness no focal tenderness identified.  No rebound or guarding.  Tympanic percussion throughout.  ED Results / Procedures / Treatments   EKG  EKG viewed and interpreted by myself shows sinus tachycardia at  103 bpm with a narrow QRS, normal axis, normal intervals, nonspecific but no concerning ST changes.  RADIOLOGY  I have reviewed and interpreted CT images.  Patient has significant amount of dilated small bowel. Radiology is read the CT scan is consistent with high-grade bowel obstruction recommend surgical consultation.  No obstructing lesion identified.  Inflammation and bowel wall edema of the distal small bowel.   MEDICATIONS ORDERED IN ED: Medications  sodium chloride 0.9 % bolus 1,000 mL (has no administration in time range)     IMPRESSION / MDM / ASSESSMENT AND PLAN / ED COURSE  I reviewed the triage vital signs and the nursing notes.  Patient's presentation is most consistent with acute presentation with potential threat to life or bodily function.  Patient presents to the emergency department for possible bowel obstruction.  Patient had a x-ray at urgent care concerning for bowel obstruction.  Patient has a distended abdomen that is tympanic to percussion mild tenderness throughout consistent with bowel obstruction as well versus ileus.  Patient's chemistry shows no significant finding, CBC reassuring lipase is normal.  We will IV hydrate the patient we will  treat with nausea medication.  Will obtain CT imaging of abdomen/pelvis to further evaluate.  Patient states no prior abdominal surgeries.  Last colonoscopy was 2 years ago with a polyp but no other concerning findings.  CT scan consistent with high-grade bowel obstruction.  We will discuss with general surgery for further recommendations.  I spoke with Dr. Tonna Boehringer of general surgery who will be down to see the patient shortly.  FINAL CLINICAL IMPRESSION(S) / ED DIAGNOSES   Abdominal pain Bowel obstruction   Note:  This document was prepared using Dragon voice recognition software and may include unintentional dictation errors.   Minna Antis, MD 12/16/22 1730

## 2022-12-16 NOTE — ED Triage Notes (Signed)
Pt sts that he was at the [facility]. Per the UC, pt has been dx with a SBO and advised to come to the ED.

## 2022-12-16 NOTE — ED Triage Notes (Signed)
Pt presents with abdominal pain and bloating x 5 days. He feels like his abdomen is torn. He vomited once yesterday and took a suppository last night.

## 2022-12-17 ENCOUNTER — Inpatient Hospital Stay: Payer: PRIVATE HEALTH INSURANCE

## 2022-12-17 ENCOUNTER — Encounter
Admission: EM | Disposition: A | Discharge status: Home / Self Care | Payer: Self-pay | Source: Home / Self Care | Attending: Surgery

## 2022-12-17 ENCOUNTER — Other Ambulatory Visit: Payer: Self-pay

## 2022-12-17 ENCOUNTER — Encounter: Payer: Self-pay | Admitting: Surgery

## 2022-12-17 ENCOUNTER — Inpatient Hospital Stay: Payer: PRIVATE HEALTH INSURANCE | Admitting: Certified Registered Nurse Anesthetist

## 2022-12-17 DIAGNOSIS — K56609 Unspecified intestinal obstruction, unspecified as to partial versus complete obstruction: Secondary | ICD-10-CM | POA: Diagnosis not present

## 2022-12-17 DIAGNOSIS — R000 Tachycardia, unspecified: Secondary | ICD-10-CM

## 2022-12-17 DIAGNOSIS — K631 Perforation of intestine (nontraumatic): Secondary | ICD-10-CM | POA: Diagnosis not present

## 2022-12-17 HISTORY — PX: LAPAROTOMY: SHX154

## 2022-12-17 LAB — HIV ANTIBODY (ROUTINE TESTING W REFLEX): HIV Screen 4th Generation wRfx: NONREACTIVE

## 2022-12-17 SURGERY — LAPAROTOMY, EXPLORATORY
Anesthesia: General

## 2022-12-17 MED ORDER — CEFAZOLIN SODIUM 1 G IJ SOLR
INTRAMUSCULAR | Status: AC
  Filled 2022-12-17: qty 20

## 2022-12-17 MED ORDER — ACETAMINOPHEN 10 MG/ML IV SOLN
INTRAVENOUS | Status: DC | PRN
  Administered 2022-12-17: 1000 mg via INTRAVENOUS

## 2022-12-17 MED ORDER — MIDAZOLAM HCL 2 MG/2ML IJ SOLN
INTRAMUSCULAR | Status: AC
  Filled 2022-12-17: qty 2

## 2022-12-17 MED ORDER — OXYCODONE HCL 5 MG PO TABS: 5.0000 mg | ORAL_TABLET | Freq: Once | ORAL | Status: DC | PRN

## 2022-12-17 MED ORDER — FENTANYL CITRATE (PF) 100 MCG/2ML IJ SOLN
INTRAMUSCULAR | Status: DC | PRN
  Administered 2022-12-17: 100 ug via INTRAVENOUS

## 2022-12-17 MED ORDER — ONDANSETRON HCL 4 MG/2ML IJ SOLN
INTRAMUSCULAR | Status: AC
  Filled 2022-12-17: qty 2

## 2022-12-17 MED ORDER — FENTANYL CITRATE (PF) 100 MCG/2ML IJ SOLN
25.0000 ug | INTRAMUSCULAR | Status: DC | PRN
  Administered 2022-12-17 (×2): 25 ug via INTRAVENOUS

## 2022-12-17 MED ORDER — SODIUM CHLORIDE FLUSH 0.9 % IV SOLN
INTRAVENOUS | Status: DC | PRN
  Administered 2022-12-17: 100 mL

## 2022-12-17 MED ORDER — ROCURONIUM BROMIDE 10 MG/ML (PF) SYRINGE
PREFILLED_SYRINGE | INTRAVENOUS | Status: AC
  Filled 2022-12-17: qty 10

## 2022-12-17 MED ORDER — PROPOFOL 10 MG/ML IV BOLUS
INTRAVENOUS | Status: AC
  Filled 2022-12-17: qty 20

## 2022-12-17 MED ORDER — ONDANSETRON HCL 4 MG/2ML IJ SOLN: 4.0000 mg | Freq: Once | INTRAMUSCULAR | Status: DC | PRN

## 2022-12-17 MED ORDER — PROPOFOL 1000 MG/100ML IV EMUL
INTRAVENOUS | Status: AC
  Filled 2022-12-17: qty 100

## 2022-12-17 MED ORDER — SUGAMMADEX SODIUM 200 MG/2ML IV SOLN
INTRAVENOUS | Status: DC | PRN
  Administered 2022-12-17: 200 mg via INTRAVENOUS

## 2022-12-17 MED ORDER — DEXAMETHASONE SODIUM PHOSPHATE 10 MG/ML IJ SOLN
INTRAMUSCULAR | Status: DC | PRN
  Administered 2022-12-17: 8 mg via INTRAVENOUS

## 2022-12-17 MED ORDER — OXYCODONE HCL 5 MG/5ML PO SOLN: 5.0000 mg | Freq: Once | ORAL | Status: DC | PRN

## 2022-12-17 MED ORDER — KETAMINE HCL 50 MG/5ML IJ SOSY
PREFILLED_SYRINGE | INTRAMUSCULAR | Status: AC
  Filled 2022-12-17: qty 5

## 2022-12-17 MED ORDER — DEXMEDETOMIDINE HCL IN NACL 80 MCG/20ML IV SOLN
INTRAVENOUS | Status: DC | PRN
Start: 2022-12-17 — End: 2022-12-17
  Administered 2022-12-17: 16 ug via INTRAVENOUS

## 2022-12-17 MED ORDER — MIDAZOLAM HCL 2 MG/2ML IJ SOLN
INTRAMUSCULAR | Status: DC | PRN
  Administered 2022-12-17: 2 mg via INTRAVENOUS

## 2022-12-17 MED ORDER — ACETAMINOPHEN 10 MG/ML IV SOLN
INTRAVENOUS | Status: AC
  Filled 2022-12-17: qty 100

## 2022-12-17 MED ORDER — ONDANSETRON HCL 4 MG/2ML IJ SOLN
INTRAMUSCULAR | Status: DC | PRN
  Administered 2022-12-17: 4 mg via INTRAVENOUS

## 2022-12-17 MED ORDER — 0.9 % SODIUM CHLORIDE (POUR BTL) OPTIME
TOPICAL | Status: DC | PRN
  Administered 2022-12-17: 4000 mL
  Administered 2022-12-17: 1000 mL

## 2022-12-17 MED ORDER — ESMOLOL HCL 100 MG/10ML IV SOLN
INTRAVENOUS | Status: DC | PRN
Start: 2022-12-17 — End: 2022-12-17
  Administered 2022-12-17: 30 ug via INTRAVENOUS
  Administered 2022-12-17: 20 ug via INTRAVENOUS

## 2022-12-17 MED ORDER — HYDROMORPHONE HCL 1 MG/ML IJ SOLN
INTRAMUSCULAR | Status: AC
  Filled 2022-12-17: qty 1

## 2022-12-17 MED ORDER — LIDOCAINE HCL (CARDIAC) PF 100 MG/5ML IV SOSY
PREFILLED_SYRINGE | INTRAVENOUS | Status: DC | PRN
  Administered 2022-12-17: 80 mg via INTRAVENOUS

## 2022-12-17 MED ORDER — SUCCINYLCHOLINE CHLORIDE 200 MG/10ML IV SOSY
PREFILLED_SYRINGE | INTRAVENOUS | Status: DC | PRN
  Administered 2022-12-17: 100 mg via INTRAVENOUS

## 2022-12-17 MED ORDER — HYDROMORPHONE HCL 1 MG/ML IJ SOLN
INTRAMUSCULAR | Status: DC | PRN
Start: 2022-12-17 — End: 2022-12-17
  Administered 2022-12-17 (×2): .5 mg via INTRAVENOUS

## 2022-12-17 MED ORDER — DEXMEDETOMIDINE HCL IN NACL 80 MCG/20ML IV SOLN
INTRAVENOUS | Status: AC
  Filled 2022-12-17: qty 20

## 2022-12-17 MED ORDER — ACETAMINOPHEN 10 MG/ML IV SOLN: 1000.0000 mg | Freq: Once | INTRAVENOUS | Status: DC | PRN

## 2022-12-17 MED ORDER — DEXAMETHASONE SODIUM PHOSPHATE 10 MG/ML IJ SOLN
INTRAMUSCULAR | Status: AC
  Filled 2022-12-17: qty 1

## 2022-12-17 MED ORDER — KETAMINE HCL 10 MG/ML IJ SOLN
INTRAMUSCULAR | Status: DC | PRN
Start: 2022-12-17 — End: 2022-12-17
  Administered 2022-12-17: 20 mg via INTRAVENOUS
  Administered 2022-12-17: 30 mg via INTRAVENOUS

## 2022-12-17 MED ORDER — CEFAZOLIN SODIUM-DEXTROSE 2-3 GM-%(50ML) IV SOLR
INTRAVENOUS | Status: DC | PRN
  Administered 2022-12-17: 2 g via INTRAVENOUS

## 2022-12-17 MED ORDER — LACTATED RINGERS IV SOLN: INTRAVENOUS | Status: DC | PRN

## 2022-12-17 MED ORDER — ROCURONIUM BROMIDE 100 MG/10ML IV SOLN
INTRAVENOUS | Status: DC | PRN
  Administered 2022-12-17 (×2): 20 mg via INTRAVENOUS
  Administered 2022-12-17: 50 mg via INTRAVENOUS
  Administered 2022-12-17: 30 mg via INTRAVENOUS

## 2022-12-17 MED ORDER — PROPOFOL 10 MG/ML IV BOLUS
INTRAVENOUS | Status: DC | PRN
Start: 2022-12-17 — End: 2022-12-17
  Administered 2022-12-17: 150 mg via INTRAVENOUS

## 2022-12-17 MED ORDER — PROPOFOL 500 MG/50ML IV EMUL
INTRAVENOUS | Status: DC | PRN
Start: 2022-12-17 — End: 2022-12-17
  Administered 2022-12-17: 150 ug/kg/min via INTRAVENOUS

## 2022-12-17 MED ORDER — FENTANYL CITRATE (PF) 100 MCG/2ML IJ SOLN
INTRAMUSCULAR | Status: AC
  Filled 2022-12-17: qty 2

## 2022-12-17 SURGICAL SUPPLY — 55 items
APL PRP STRL LF DISP 70% ISPRP (MISCELLANEOUS) ×1
CHLORAPREP W/TINT 26 (MISCELLANEOUS) ×1 IMPLANT
DRAPE 3/4 80X56 (DRAPES) IMPLANT
DRAPE LAPAROTOMY 100X77 ABD (DRAPES) ×1 IMPLANT
DRSG OPSITE POSTOP 4X10 (GAUZE/BANDAGES/DRESSINGS) ×1 IMPLANT
DRSG OPSITE POSTOP 4X8 (GAUZE/BANDAGES/DRESSINGS) ×1 IMPLANT
ELECT BLADE 6.5 EXT (BLADE) IMPLANT
ELECT CAUTERY BLADE 6.4 (BLADE) ×1 IMPLANT
ELECT REM PT RETURN 9FT ADLT (ELECTROSURGICAL) ×1
ELECTRODE REM PT RTRN 9FT ADLT (ELECTROSURGICAL) ×1 IMPLANT
GAUZE 4X4 16PLY ~~LOC~~+RFID DBL (SPONGE) ×1 IMPLANT
GLOVE BIOGEL PI IND STRL 6.5 (GLOVE) IMPLANT
GLOVE BIOGEL PI IND STRL 7.0 (GLOVE) ×1 IMPLANT
GLOVE SURG SYN 6.5 ES PF (GLOVE) ×3 IMPLANT
GLOVE SURG SYN 6.5 PF PI (GLOVE) ×2 IMPLANT
GOWN STRL REUS W/ TWL LRG LVL3 (GOWN DISPOSABLE) ×2 IMPLANT
GOWN STRL REUS W/TWL LRG LVL3 (GOWN DISPOSABLE) ×2
KIT OSTOMY DRAINABLE 2.75 STR (WOUND CARE) IMPLANT
KIT PREVENA INCISION MGT20CM45 (CANNISTER) IMPLANT
KIT TURNOVER KIT A (KITS) ×1 IMPLANT
LABEL OR SOLS (LABEL) ×1 IMPLANT
LIGASURE IMPACT 36 18CM CVD LR (INSTRUMENTS) IMPLANT
MANIFOLD NEPTUNE II (INSTRUMENTS) ×1 IMPLANT
NDL HYPO 22X1.5 SAFETY MO (MISCELLANEOUS) IMPLANT
NEEDLE HYPO 22X1.5 SAFETY MO (MISCELLANEOUS) IMPLANT
NS IRRIG 1000ML POUR BTL (IV SOLUTION) ×1 IMPLANT
PACK BASIN MAJOR ARMC (MISCELLANEOUS) ×1 IMPLANT
PACK COLON CLEAN CLOSURE (MISCELLANEOUS) ×1 IMPLANT
RELOAD GRN CONTOUR (ENDOMECHANICALS) ×1 IMPLANT
RELOAD PROXIMATE 75MM BLUE (ENDOMECHANICALS) IMPLANT
RELOAD STAPLE 40 GRN THCK (ENDOMECHANICALS) IMPLANT
RELOAD STAPLE 75 3.8 BLU REG (ENDOMECHANICALS) IMPLANT
RETRACTOR WOUND ALXS 18CM MED (MISCELLANEOUS) IMPLANT
RTRCTR WOUND ALEXIS O 18CM MED (MISCELLANEOUS) ×1
SPONGE T-LAP 18X18 ~~LOC~~+RFID (SPONGE) ×4 IMPLANT
STAPLER CVD CUT GN 40 RELOAD (ENDOMECHANICALS) ×1 IMPLANT
STAPLER CVD CUT GRN 40 RELOAD (ENDOMECHANICALS) IMPLANT
STAPLER PROXIMATE 75MM BLUE (STAPLE) IMPLANT
STAPLER SKIN PROX 35W (STAPLE) ×1 IMPLANT
SUT PDS AB 1 TP1 54 (SUTURE) ×2 IMPLANT
SUT PROLENE 2 0 FS (SUTURE) IMPLANT
SUT SILK 2 0 (SUTURE) ×1
SUT SILK 2-0 18XBRD TIE 12 (SUTURE) ×1 IMPLANT
SUT SILK 3 0 (SUTURE) ×1
SUT SILK 3-0 (SUTURE) IMPLANT
SUT SILK 3-0 18XBRD TIE 12 (SUTURE) ×1 IMPLANT
SUT VIC AB 3-0 SH 27 (SUTURE) ×2
SUT VIC AB 3-0 SH 27X BRD (SUTURE) ×2 IMPLANT
SUT VICRYL 3-0 CR8 SH (SUTURE) IMPLANT
SWAB CULTURE AMIES ANAERIB BLU (MISCELLANEOUS) IMPLANT
SYR 20ML LL LF (SYRINGE) IMPLANT
TRAP FLUID SMOKE EVACUATOR (MISCELLANEOUS) ×1 IMPLANT
TRAY FOLEY MTR SLVR 16FR STAT (SET/KITS/TRAYS/PACK) ×1 IMPLANT
TRAY FOLEY SLVR 16FR LF STAT (SET/KITS/TRAYS/PACK) IMPLANT
WATER STERILE IRR 500ML POUR (IV SOLUTION) ×1 IMPLANT

## 2022-12-17 NOTE — Transfer of Care (Signed)
Immediate Anesthesia Transfer of Care Note  Patient: Walter Tyler  Procedure(s) Performed: EXPLORATORY LAPAROTOMY  Patient Location: PACU  Anesthesia Type:General  Level of Consciousness: awake  Airway & Oxygen Therapy: Patient Spontanous Breathing and Patient connected to face mask oxygen  Post-op Assessment: Report given to RN and Post -op Vital signs reviewed and stable  Post vital signs: Reviewed and stable  Last Vitals:  Vitals Value Taken Time  BP 141/106 12/17/22 2002  Temp    Pulse 104   Resp 17 12/17/22 2003  SpO2 100   Vitals shown include unfiled device data.  Last Pain:  Vitals:   12/17/22 1529  TempSrc: Temporal  PainSc: 0-No pain      Patients Stated Pain Goal: 0 (12/17/22 1529)  Complications: No notable events documented.

## 2022-12-17 NOTE — Progress Notes (Signed)
Subjective:  CC: Lonn Luginbill is a 67 y.o. male  Hospital stay day 1,   SBO  HPI: No changes.    ROS:  General: Denies weight loss, weight gain, fatigue, fevers, chills, and night sweats. Heart: Denies chest pain, palpitations, racing heart, irregular heartbeat, leg pain or swelling, and decreased activity tolerance. Respiratory: Denies breathing difficulty, shortness of breath, wheezing, cough, and sputum. GI: Denies change in appetite, heartburn, nausea, vomiting, constipation, diarrhea, and blood in stool. GU: Denies difficulty urinating, pain with urinating, urgency, frequency, blood in urine.   Objective:   Temp:  [98.1 F (36.7 C)-99.6 F (37.6 C)] 98.1 F (36.7 C) (07/26 0859) Pulse Rate:  [74-113] 74 (07/26 0859) Resp:  [16-25] 16 (07/26 0859) BP: (106-159)/(66-88) 136/86 (07/26 0859) SpO2:  [93 %-99 %] 94 % (07/26 0859) Weight:  [77.1 kg] 77.1 kg (07/25 1336)     Height: 5\' 10"  (177.8 cm) Weight: 77.1 kg BMI (Calculated): 24.39   Intake/Output this shift:   Intake/Output Summary (Last 24 hours) at 12/17/2022 1052 Last data filed at 12/17/2022 0554 Gross per 24 hour  Intake 1090 ml  Output 475 ml  Net 615 ml    Constitutional :  alert, cooperative, appears stated age, and no distress  Respiratory:  clear to auscultation bilaterally  Cardiovascular:  regular rate and rhythm  Gastrointestinal: Soft, focal guarding in suprapubic area now.  Still has moderate TTP RLQ and LLQ .   Skin: Cool and moist.   Psychiatric: Normal affect, non-agitated, not confused       LABS:     Latest Ref Rng & Units 12/16/2022    1:40 PM 12/31/2021    8:22 AM 12/30/2020    8:23 AM  CMP  Glucose 70 - 99 mg/dL 540  77  82   BUN 8 - 23 mg/dL 14  15  21    Creatinine 0.61 - 1.24 mg/dL 9.81  1.91  4.78   Sodium 135 - 145 mmol/L 130  142  141   Potassium 3.5 - 5.1 mmol/L 3.9  4.1  4.2   Chloride 98 - 111 mmol/L 95  106  108   CO2 22 - 32 mmol/L 23  28  27    Calcium 8.9 - 10.3 mg/dL 8.7   8.7  8.9   Total Protein 6.5 - 8.1 g/dL 8.2  7.0  7.2   Total Bilirubin 0.3 - 1.2 mg/dL 1.1  0.3  0.3   Alkaline Phos 38 - 126 U/L 46     AST 15 - 41 U/L 36  37  41   ALT 0 - 44 U/L 31  26  25        Latest Ref Rng & Units 12/16/2022    1:40 PM 12/31/2021    8:22 AM 12/30/2020    8:23 AM  CBC  WBC 4.0 - 10.5 K/uL 5.4  3.5  3.6   Hemoglobin 13.0 - 17.0 g/dL 29.5  62.1  30.8   Hematocrit 39.0 - 52.0 % 42.7  41.5  42.7   Platelets 150 - 400 K/uL 212  176  195     RADS: CLINICAL DATA:  Small-bowel obstruction follow-up.   EXAM: PORTABLE ABDOMEN - 1 VIEW   COMPARISON:  Single-view yesterday at 9:25 p.m.   FINDINGS: NGT is within the stomach directed to the left. There is still diffuse small bowel dilatation up to 5.3 cm, not significantly changed.   There is contrast in the urinary bladder. There is no supine evidence of  free air. No change in the overall bowel aeration.   IMPRESSION: Persistent diffuse small bowel dilatation up to 5.3 cm, not significantly changed.     Electronically Signed   By: Almira Bar M.D.   On: 12/17/2022 07:27 Assessment:   SBO- no improvement on SBFT, worsening clinical exam despite NG tube decompression, as well as feculant output noted.  Recommend proceeding with ex-lap, possible bowel resection at this time due to worsening clinical picture.  Discussed pathophisiology and treatment options including NG tube decompression and possible surgery for lysis of adhesions, laparoscopic or open.  The risk of surgery include, but not limited to, recurrence, bleeding, chronic pain, post-op infxn, post-op SBO or ileus, hernias, resection of bowel, re-anastamosis, possible ostomy placement and need for re-operation to address said risks. The risks of general anesthetic, if used, includes MI, CVA, sudden death or even reaction to anesthetic medications also discussed. Alternatives include continued observation and NG decompression.  Benefits include possible  symptom relief, preventing further decline in health and possible death.  Typical post-op recovery time of additional days in hospital for observation afterwards also discussed.  The patient verbalized understanding and all questions were answered to the patient's satisfaction.  labs/images/medications/previous chart entries reviewed personally and relevant changes/updates noted above.

## 2022-12-17 NOTE — Progress Notes (Signed)
Patient awake to name, needs re-oriented, confused. Medicated for abd discomfort as ordered. Dr. Tonna Boehringer at bedside, noted on cardiac monitor SR=ST with PAC's.  Per Dr. Tonna Boehringer please get baseline EKG:  done.   Family upated. Afebrile  vitals stable

## 2022-12-17 NOTE — Progress Notes (Signed)
   12/17/22 2100  Spiritual Encounters  Type of Visit Initial  Care provided to: Family  Referral source Patient request;Nurse (RN/NT/LPN)  Reason for visit Urgent spiritual support  OnCall Visit Yes  Spiritual Framework  Presenting Themes Significant life change;Caregiving needs  Patient Stress Factors None identified  Family Stress Factors Lack of knowledge;Major life changes  Interventions  Spiritual Care Interventions Made Established relationship of care and support;Compassionate presence;Reflective listening;Normalization of emotions;Explored ethical dilemma  Intervention Outcomes  Outcomes Connection to spiritual care;Awareness of support;Reduced anxiety  Spiritual Care Plan  Spiritual Care Issues Still Outstanding Chaplain will continue to follow;No further spiritual care needs at this time (see row info)   Spoke with patient's wife concern patient's medical procedure. Wife was having difficult time trying to find the right time and right way to tell husband about his surgery. Patient has a colostomy bag and not sure how the PT will respond. PT is still under meds and is in and out of sleep. Wife and I decided that tomorrow would be better timing to tell patient. Advise wife we are here 24/7 if she needs our help as pastoral care department.

## 2022-12-17 NOTE — Anesthesia Preprocedure Evaluation (Signed)
Anesthesia Evaluation  Patient identified by MRN, date of birth, ID band Patient awake  General Assessment Comment:  SBO, NGT in place  Reviewed: Allergy & Precautions, NPO status , Patient's Chart, lab work & pertinent test results  History of Anesthesia Complications Negative for: history of anesthetic complications  Airway Mallampati: III  TM Distance: >3 FB Neck ROM: Full    Dental no notable dental hx. (+) Teeth Intact   Pulmonary neg pulmonary ROS, neg sleep apnea, neg COPD, Patient abstained from smoking.Not current smoker   Pulmonary exam normal breath sounds clear to auscultation       Cardiovascular Exercise Tolerance: Good METS(-) hypertension(-) CAD and (-) Past MI negative cardio ROS (-) dysrhythmias  Rhythm:Regular Rate:Normal - Systolic murmurs    Neuro/Psych negative neurological ROS  negative psych ROS   GI/Hepatic ,neg GERD  ,,(+)     (-) substance abuse    Endo/Other  neg diabetes    Renal/GU negative Renal ROS     Musculoskeletal   Abdominal  (+)  Abdomen: tender.   Peds  Hematology   Anesthesia Other Findings Past Medical History: No date: BPH (benign prostatic hyperplasia) No date: Shoulder pain, acute  Reproductive/Obstetrics                             Anesthesia Physical Anesthesia Plan  ASA: 2 and emergent  Anesthesia Plan: General   Post-op Pain Management: Ofirmev IV (intra-op)*   Induction: Intravenous and Rapid sequence  PONV Risk Score and Plan: 4 or greater and Ondansetron, Dexamethasone, Midazolam, TIVA and Propofol infusion  Airway Management Planned: Oral ETT and Video Laryngoscope Planned  Additional Equipment: None  Intra-op Plan:   Post-operative Plan: Extubation in OR  Informed Consent: I have reviewed the patients History and Physical, chart, labs and discussed the procedure including the risks, benefits and alternatives for the  proposed anesthesia with the patient or authorized representative who has indicated his/her understanding and acceptance.     Dental advisory given  Plan Discussed with: CRNA and Surgeon  Anesthesia Plan Comments: (Discussed risks of anesthesia with patient, including PONV, sore throat, lip/dental/eye damage, aspiration. Rare risks discussed as well, such as cardiorespiratory and neurological sequelae, and allergic reactions. Discussed the role of CRNA in patient's perioperative care. Patient understands.)       Anesthesia Quick Evaluation

## 2022-12-17 NOTE — Anesthesia Procedure Notes (Signed)
Procedure Name: Intubation Date/Time: 12/17/2022 3:45 PM  Performed by: Lynden Oxford, CRNAPre-anesthesia Checklist: Patient identified, Emergency Drugs available, Suction available and Patient being monitored Patient Re-evaluated:Patient Re-evaluated prior to induction Oxygen Delivery Method: Circle system utilized Preoxygenation: Pre-oxygenation with 100% oxygen Induction Type: Rapid sequence and IV induction Laryngoscope Size: McGraph and 3 Grade View: Grade I Tube type: Oral Tube size: 7.0 mm Number of attempts: 1 Airway Equipment and Method: Stylet and Video-laryngoscopy Placement Confirmation: ETT inserted through vocal cords under direct vision, positive ETCO2 and breath sounds checked- equal and bilateral Secured at: 23 cm Tube secured with: Tape Dental Injury: Teeth and Oropharynx as per pre-operative assessment

## 2022-12-17 NOTE — Anesthesia Postprocedure Evaluation (Signed)
Anesthesia Post Note  Patient: Walter Tyler  Procedure(s) Performed: EXPLORATORY LAPAROTOMY  Patient location during evaluation: PACU Anesthesia Type: General Level of consciousness: awake and alert Pain management: pain level controlled Vital Signs Assessment: post-procedure vital signs reviewed and stable Respiratory status: spontaneous breathing, nonlabored ventilation, respiratory function stable and patient connected to nasal cannula oxygen Cardiovascular status: blood pressure returned to baseline and stable Postop Assessment: no apparent nausea or vomiting Anesthetic complications: no   No notable events documented.   Last Vitals:  Vitals:   12/17/22 2030 12/17/22 2101  BP: (!) 145/87 122/86  Pulse: 74 61  Resp: 18 20  Temp:  (!) 36.4 C  SpO2: 97% 98%    Last Pain:  Vitals:   12/17/22 2101  TempSrc: Oral  PainSc:                  [name]

## 2022-12-17 NOTE — Op Note (Addendum)
Preoperative diagnosis: SBO  Postoperative diagnosis: same, plus colon perforation, plus small bowel serosal injury  Procedure: Exploratory laparatomy, lysis of adhesions, abdominal washout, sigmoidectomy with end colostomy (Hartmann's procedure). Small bowel serosal injury repair  Anesthesia: GETA  Surgeon: Sung Amabile, DO  Assistant: Piscoya for exposure and dissection due to absence of other qualified individuals  Wound Classification: contaminated  Indications:  Patient is a 67 y.o. male with initial presentation of SBO, with no clinical improvement so taken for ex-lap  Specimen: sigmoid colon  Complications: None apparent  EBL: 80 mL  Findings: Extensive purulent drainage throughout, with infection present within the abdominal cavity.  Source of purulence from section of thickened distal sigmoid colon with perforation. SBO caused by adhesions attached to perforation and adjacent small bowel.  Small bowel extreme dilation and congestion with discoloration, which improved throughout by end of procedure. Small bowel serosal tear x 2, proximal jejunum, repaired primary with 3-0 silk x2.  No bleeding  Healthy ostomy    Description of procedure:  The patient was placed in the supine position and general endotracheal anesthesia was induced. A time-out was completed verifying correct patient, procedure, site, positioning, and implant(s) and/or special equipment prior to beginning this procedure. Preoperative antibiotics were continued from floor. The abdomen was prepped and draped in the usual sterile fashion. A vertical midline incision initially made periumbilcally. This was deepened through the subcutaneous tissues and hemostasis was achieved with electrocautery. The linea alba was identified and incised and the peritoneal cavity entered. The abdomen was explored. Extensive purulent drainage throughout, with infection present within the abdominal cavity.  Source of purulence from  section of thickened distal sigmoid colon with perforation. SBO caused by adhesions attached to perforation and adjacent small bowel.  Small bowel extreme dilation and congestion with discoloration.  Rest of Colon and very distal ileum decompressed but still had extensive inflammatory reaction.  Extensive lysis of adhesion performed with finger fracture and metz to relieve small bowel obstruction.  Small bowel then inspected from ligament of treitz to ileocecal valve mulitple times.  Areas of congestion and bruising noted on serosa and mesentary throughout.  Small bowel extreme dilation and congestion with discoloration, all improved throughout by end of procedure after milking contents back to stomach.  Feculant output noted from NG . Small bowel serosal tear x 2, proximal jejunum noted, repaired primary with 3-0 silk x2.  Due to extensive infection and inflammation, decision made to proceed with hartman's.  Dr. Aleen Campi requested for assistance and assisted for exposure and dissection due to absence of other qualified individuals.  Unexpected finding as source of SBO, so called wife and updated, verbal consent obtained to proceed with colostomy creation. Small bowel retracted to the right. Using electrocautery, the sigmoid colon was freed from its peritoneal attachments along the line of Toldt.  Points of transection were selected proximally and distally where there was no palpable thickening. The bowel was divided with the green cartridge contoured cutting stapler at the proximal end and contoured stapler at the distal end. Ligasure used to divide the mesentery, incorporating some of the mesentary and palpable lymph nodes in the surrounding the perforation.   The specimen was removed and sent to pathology. Pt did have some diverticulum visible, but no other obvious pathology visible on the outside. The Hartmann's pouch was tagged with two long sutures of 2-0 prolene and allowed to fall into the pelvis. The  abdominal cavity was then copiously irrigated until clear lfluid return noted and hemostasis was checked.  The proximal colon reached easily to the proposed colostomy site on left abdominal wall without tension. A disk of skin was removed from the colostomy site and the incision was deepened through all layers of the abdominal wall and dilated to admit two fingers. The colon was passed out through the ostomy site without torsion or tension.     Prior to closure, instrument count noted to have discrepancy.  One extra kelley clamp missing and one bowel clamp added to tray during transition to colon portion of procedure not documented.  Assumption is bowel clamp was documented as a kelley clamp, and this was cause of discrepancy.  Extensive search for a kelley clamp done to confirm, and additional instrument found.  Per protocol, xray done to confirm no retained instrument within abdominal cavity while patient abdomen still open under sterile conditions.    After confirming no lost instrument.  A clean closure protocol initiated and the midline incision fascia was closed with running suture of PDS 0 x2.  The incision had to be extended mid procedure for better visualization. The skin was closed with skin staples and dressed with provena incisional vac.  The colostomy was matured with multiple interrupted sutures of 3-0 Vicryl. An ostomy bag was applied. Mucosa pink, minimal congestion, lumen patent, no obvious bleeding.  Site then dressed with colostomy bag appliance.  The patient tolerated the procedure well, extubated, and transferred to PACU in stable condition. NG and foley in place.  Sponge count and instrument count all correct at end of procedure.

## 2022-12-18 ENCOUNTER — Encounter: Payer: Self-pay | Admitting: Surgery

## 2022-12-18 DIAGNOSIS — K631 Perforation of intestine (nontraumatic): Secondary | ICD-10-CM

## 2022-12-18 LAB — BASIC METABOLIC PANEL
Anion gap: 8 (ref 5–15)
BUN: 16 mg/dL (ref 8–23)
CO2: 26 mmol/L (ref 22–32)
Calcium: 8 mg/dL — ABNORMAL LOW (ref 8.9–10.3)
Chloride: 100 mmol/L (ref 98–111)
Creatinine, Ser: 0.94 mg/dL (ref 0.61–1.24)
GFR, Estimated: >60 mL/min (ref >60)
Glucose, Bld: 137 mg/dL — ABNORMAL HIGH (ref 70–99)
Potassium: 4.7 mmol/L (ref 3.5–5.1)
Sodium: 134 mmol/L — ABNORMAL LOW (ref 135–145)

## 2022-12-18 LAB — CBC
HCT: 39.6 % (ref 39.0–52.0)
Hemoglobin: 13.5 g/dL (ref 13.0–17.0)
MCH: 29.8 pg (ref 26.0–34.0)
MCHC: 34.1 g/dL (ref 30.0–36.0)
MCV: 87.4 fL (ref 80.0–100.0)
Platelets: 218 10*3/uL (ref 150–400)
RBC: 4.53 MIL/uL (ref 4.22–5.81)
RDW: 14.7 % (ref 11.5–15.5)
WBC: 7.8 10*3/uL (ref 4.0–10.5)
nRBC: 0 % (ref 0.0–0.2)

## 2022-12-18 LAB — MAGNESIUM: Magnesium: 2.3 mg/dL (ref 1.7–2.4)

## 2022-12-18 MED ORDER — PIPERACILLIN-TAZOBACTAM 3.375 G IVPB
3.3750 g | Freq: Three times a day (TID) | INTRAVENOUS | Status: AC
  Administered 2022-12-18 – 2022-12-23 (×15): 3.375 g via INTRAVENOUS
  Filled 2022-12-18 (×15): qty 50

## 2022-12-18 MED ORDER — CELECOXIB 200 MG PO CAPS
200.0000 mg | ORAL_CAPSULE | Freq: Two times a day (BID) | ORAL | Status: DC
  Administered 2022-12-18 – 2022-12-25 (×15): 200 mg via ORAL
  Filled 2022-12-18 (×15): qty 1

## 2022-12-18 MED ORDER — KETOROLAC TROMETHAMINE 30 MG/ML IJ SOLN
15.0000 mg | Freq: Four times a day (QID) | INTRAMUSCULAR | Status: AC | PRN
  Administered 2022-12-18 – 2022-12-19 (×3): 15 mg via INTRAVENOUS
  Filled 2022-12-18 (×3): qty 1

## 2022-12-18 MED ORDER — GABAPENTIN 300 MG PO CAPS
300.0000 mg | ORAL_CAPSULE | Freq: Two times a day (BID) | ORAL | Status: DC
  Administered 2022-12-18 – 2022-12-25 (×15): 300 mg via ORAL
  Filled 2022-12-18 (×12): qty 1
  Filled 2022-12-18: qty 3
  Filled 2022-12-18 (×2): qty 1

## 2022-12-18 MED ORDER — CHLORHEXIDINE GLUCONATE CLOTH 2 % EX PADS
6.0000 | MEDICATED_PAD | Freq: Every day | CUTANEOUS | Status: DC
  Administered 2022-12-18: 6 via TOPICAL

## 2022-12-18 NOTE — Progress Notes (Signed)
12/18/2022  Subjective: Patient is postop day 1 status post exploratory laparotomy with Hartman's procedure due to perforated colon.  No acute events overnight.  This morning the patient appears to be well and in good spirits.  Explained to him better all the findings and the surgery and the reason for having a colostomy.  His wife is at bedside with him.  Labs today are reassuring with a normal white blood cell count of 7.8 and normal creatinine of 0.94.  Vital signs: Temp:  [97.5 F (36.4 C)-98.4 F (36.9 C)] 98.2 F (36.8 C) (07/27 0915) Pulse Rate:  [53-80] 53 (07/27 0915) Resp:  [14-20] 20 (07/27 0915) BP: (117-145)/(81-106) 139/87 (07/27 0915) SpO2:  [96 %-99 %] 99 % (07/27 0915)   Intake/Output: 07/26 0701 - 07/27 0700 In: 2510 [I.V.:2500; IV Piggyback:10] Out: 1150 [Urine:535; Emesis/NG output:615] Last BM Date : 12/16/22  Physical Exam: Constitutional: No acute distress Abdomen: Soft, nondistended, appropriately sore to palpation.  Patient has a Prevena VAC over the midline incision with good suction seal.  Left-sided end colostomy with only some bowel sweat but no stool or gas.  Labs:  Recent Labs    12/16/22 1340 12/18/22 0639  WBC 5.4 7.8  HGB 14.8 13.5  HCT 42.7 39.6  PLT 212 218   Recent Labs    12/16/22 1340 12/18/22 0639  NA 130* 134*  K 3.9 4.7  CL 95* 100  CO2 23 26  GLUCOSE 115* 137*  BUN 14 16  CREATININE 1.02 0.94  CALCIUM 8.7* 8.0*   No results for input(s): "LABPROT", "INR" in the last 72 hours.  Imaging: DG OR LOCAL ABDOMEN  Result Date: 12/17/2022 CLINICAL DATA:  Incorrect sponge count EXAM: OR LOCAL ABDOMEN COMPARISON:  12/17/2022 at 6:57 a.m. FINDINGS: 2 supine frontal views of the abdomen and pelvis are obtained, excluding the hemidiaphragms by collimation. Forceps overlie the left lower quadrant abdomen. Curvilinear radiopaque density overlies the right lower quadrant abdomen and central pelvis, which could reflect barium impregnated  thread from laparotomy pad. Indeterminate rectangular radiopaque object overlying right upper quadrant measures 10.4 x 2.3 cm. Gaseous distention of the small bowel, slightly decreased since prior study, consistent with residual obstruction or ileus. IMPRESSION: 1. Rectangular radiopaque object overlying right upper quadrant is indeterminate. According to the surgeon, the patient has a medication cutaneous patch in this region and this could reflect the x-ray finding. 2. Barium impregnated thread within a laparotomy pad overlying the lower abdomen and pelvis as above. According to the surgeon, a laparotomy pad was left on the open abdomen during surgery. 3. Forceps overlying left lower quadrant. 4. Continued gaseous distention of the small bowel, improved since preoperative evaluation. These results were called by telephone at the time of interpretation on 12/17/2022 at 7:04 pm to provider [facility] , who verbally acknowledged these results. Electronically Signed   By: Sharlet Salina M.D.   On: 12/17/2022 19:04    Assessment/Plan: This is a 67 y.o. male status post exploratory laparotomy with Hartman's procedure.  - Discussed with patient and his wife the intraoperative findings and the reasoning behind doing an end colostomy rather than trying to do an anastomosis.  Discussed with him also that given all the inflammatory response inside his abdomen, it would not be surprising that he has a postoperative ileus and that is why he has an NG tube in place until he has return of bowel function.  Discussed with him that sometimes this can take longer and if that is the  case he may end up needing IV nutrition depending on how many days he remains NPO.   - For now he is otherwise doing well and will be able to DC his Foley catheter today.  He can ambulate with assistance.  Continue IV antibiotics.  Consult has been placed for WOC team.   I spent 35 minutes dedicated to the care of this patient on the date of this  encounter to include pre-visit review of records, face-to-face time with the patient discussing diagnosis and management, and any post-visit coordination of care.  Howie Ill, MD [facility]

## 2022-12-18 NOTE — Plan of Care (Signed)

## 2022-12-18 NOTE — TOC Initial Note (Addendum)
Transition of Care [facility]) - Initial/Assessment Note    Patient Details  Name: Walter Tyler MRN: [id number] Date of Birth: 07-10-1955  [facility] CM/SW Contact:    Liliana Cline, LCSW Phone Number: 12/18/2022, 9:44 AM  Clinical Narrative:                 MD has ordered [facility].  Spoke with spouse by phone. Patient lives with his spouse who will provide support at DC. PCP is Dr. Carlynn Purl. Pharmacy is [facility]. Confirmed home address. No DME or HH history. Patient's wife is agreeable to [facility]. No agency preference. CSW checked with the following: -[facility] - Barbara Cower unable to take patient's insurance -[facility] - Kandee Keen is checking if they can take the patient -[facility] Fallen - [facility] unable to take patient's insurance.   Expected Discharge Plan: Home w Home Health Services Barriers to Discharge: Continued Medical Work up   Patient Goals and CMS Choice Patient states their goals for this hospitalization and ongoing recovery are:: home with home health RN CMS Medicare.gov Compare Post Acute Care list provided to:: Patient Represenative (must comment) Choice offered to / list presented to : Spouse      Expected Discharge Plan and Services       Living arrangements for the past 2 months: Single Family Home                                      Prior Living Arrangements/Services Living arrangements for the past 2 months: Single Family Home Lives with:: Spouse Patient language and need for interpreter reviewed:: Yes Do you feel safe going back to the place where you live?: Yes      Need for Family Participation in Patient Care: Yes (Comment) Care giver support system in place?: Yes (comment)   Criminal Activity/Legal Involvement Pertinent to Current Situation/Hospitalization: No - Comment as needed  Activities of Daily Living Home Assistive Devices/Equipment: None ADL Screening (condition at time of admission) Patient's cognitive ability adequate to safely complete  daily activities?: Yes Is the patient deaf or have difficulty hearing?: No Does the patient have difficulty seeing, even when wearing glasses/contacts?: No Does the patient have difficulty concentrating, remembering, or making decisions?: No Patient able to express need for assistance with ADLs?: Yes Does the patient have difficulty dressing or bathing?: No Independently performs ADLs?: Yes (appropriate for developmental age) Does the patient have difficulty walking or climbing stairs?: No Weakness of Legs: None Weakness of Arms/Hands: None  Permission Sought/Granted Permission sought to share information with : Facility Industrial/product designer granted to share information with : Yes, Verbal Permission Granted     Permission granted to share info w AGENCY: Home Health agencies        Emotional Assessment         Alcohol / Substance Use: Not Applicable Psych Involvement: No (comment)  Admission diagnosis:  Small bowel obstruction (HCC) [K56.609] SBO (small bowel obstruction) (HCC) [K56.609] Patient Active Problem List   Diagnosis Date Noted   SBO (small bowel obstruction) (HCC) 12/16/2022   Benign prostatic hyperplasia without lower urinary tract symptoms 12/31/2021   History of fracture due to fall 03/07/2012   PCP:  Alba Cory, MD Pharmacy:   [facility] [address] [facility] - [place], [place] - [address] AT [facility] [address] [place] [address] Phone: [phone] Fax: [phone]  [id number] [place], [place] -  [address] [place] [address] Phone: [phone] Fax: [phone]     Social Determinants of Health (SDOH) Social History: SDOH Screenings   Food Insecurity: No Food Insecurity (12/16/2022)  Housing: Patient Declined (12/16/2022)  Transportation Needs: No Transportation Needs (12/16/2022)  Utilities: Not At Risk (12/16/2022)  Alcohol Screen: Low Risk  (12/30/2020)  Depression (PHQ2-9): Low Risk   (12/31/2021)  Financial Resource Strain: Low Risk  (12/31/2021)  Physical Activity: Sufficiently Active (12/31/2021)  Social Connections: Socially Integrated (12/31/2021)  Stress: No Stress Concern Present (12/31/2021)  Tobacco Use: Low Risk  (12/17/2022)   SDOH Interventions:     Readmission Risk Interventions     No data to display

## 2022-12-19 LAB — CBC
HCT: 35.8 % — ABNORMAL LOW (ref 39.0–52.0)
Hemoglobin: 12.1 g/dL — ABNORMAL LOW (ref 13.0–17.0)
MCH: 29.5 pg (ref 26.0–34.0)
MCHC: 33.8 g/dL (ref 30.0–36.0)
MCV: 87.3 fL (ref 80.0–100.0)
Platelets: 232 10*3/uL (ref 150–400)
RBC: 4.1 MIL/uL — ABNORMAL LOW (ref 4.22–5.81)
RDW: 14.8 % (ref 11.5–15.5)
WBC: 8.2 10*3/uL (ref 4.0–10.5)
nRBC: 0 % (ref 0.0–0.2)

## 2022-12-19 LAB — BASIC METABOLIC PANEL WITH GFR
Anion gap: 7 (ref 5–15)
BUN: 27 mg/dL — ABNORMAL HIGH (ref 8–23)
CO2: 27 mmol/L (ref 22–32)
Calcium: 7.8 mg/dL — ABNORMAL LOW (ref 8.9–10.3)
Chloride: 102 mmol/L (ref 98–111)
Creatinine, Ser: 1.11 mg/dL (ref 0.61–1.24)
GFR, Estimated: >60 mL/min (ref >60)
Glucose, Bld: 108 mg/dL — ABNORMAL HIGH (ref 70–99)
Potassium: 4.5 mmol/L (ref 3.5–5.1)
Sodium: 136 mmol/L (ref 135–145)

## 2022-12-19 MED ORDER — PANTOPRAZOLE SODIUM 40 MG PO TBEC
40.0000 mg | DELAYED_RELEASE_TABLET | Freq: Every day | ORAL | Status: DC
  Administered 2022-12-19 – 2022-12-24 (×6): 40 mg via ORAL
  Filled 2022-12-19 (×6): qty 1

## 2022-12-19 MED ORDER — ORAL CARE MOUTH RINSE: 15.0000 mL | OROMUCOSAL | Status: DC | PRN

## 2022-12-19 NOTE — Progress Notes (Signed)
12/19/2022  Subjective: No acute events overnight.  Patient reports that today he is feeling better.  No ostomy function yet.  WBC remains normal at 8.2.  His BUN and creatinine bumped just slightly compared to yesterday but he is still making appropriate urine output.  Vital signs: Temp:  [98 F (36.7 C)-98.4 F (36.9 C)] 98.4 F (36.9 C) (07/28 0750) Pulse Rate:  [56-65] 56 (07/28 0750) Resp:  [16-20] 18 (07/28 0750) BP: (127-142)/(83-92) 137/83 (07/28 0750) SpO2:  [96 %-97 %] 97 % (07/28 0750)   Intake/Output: 07/27 0701 - 07/28 0700 In: 2680 [I.V.:2630; IV Piggyback:50] Out: 1100 [Urine:650; Emesis/NG output:450] Last BM Date : 12/16/22  Physical Exam: Constitutional: No acute distress Abdomen: Soft, nondistended, appropriately sore to palpation.  Midline incision is covered with Prevena VAC.  Ostomy with some mild erythema at the mucosa and bowel sweat but no true function yet.  Labs:  Recent Labs    12/18/22 0639 12/19/22 0408  WBC 7.8 8.2  HGB 13.5 12.1*  HCT 39.6 35.8*  PLT 218 232   Recent Labs    12/18/22 0639 12/19/22 0408  NA 134* 136  K 4.7 4.5  CL 100 102  CO2 26 27  GLUCOSE 137* 108*  BUN 16 27*  CREATININE 0.94 1.11  CALCIUM 8.0* 7.8*   No results for input(s): "LABPROT", "INR" in the last 72 hours.  Imaging: No results found.  Assessment/Plan: This is a 67 y.o. male status post exploratory laparotomy and Hartman's procedure.  - Patient is still doing well and reports that his pain has been improving.  White blood cell count remains normal.  We will continue him on IV antibiotics for now. - Continue n.p.o., with NG tube to suction, while awaiting for return of bowel function.  Discussed with him that the mild edema at the mucosa of the ostomy will get better on its own. - Will increase his IV fluids slightly to 225 MLS per hour given his mild increase in BUN/creatinine. - Out of bed, ambulate as tolerated.   I spent 35 minutes dedicated to  the care of this patient on the date of this encounter to include pre-visit review of records, face-to-face time with the patient discussing diagnosis and management, and any post-visit coordination of care.  Howie Ill, MD [facility]

## 2022-12-19 NOTE — Consult Note (Signed)
WOC Nurse Consult Note: POD 2 Reason for Consult:New colostomy (12/17/22, Dr. Tonna Boehringer)  [facility] Nursing has received consult for patient and will see on Monday, 12/20/22.  WOC nursing team will follow, and will remain available to this patient, the nursing and medical teams.  P  Thank you for inviting [place] to participate in this patient's Plan of Care.  Ladona Mow, MSN, RN, CNS, GNP, Leda Min, [employer], [employer] phone:  [phone]

## 2022-12-19 NOTE — Plan of Care (Signed)

## 2022-12-19 NOTE — Plan of Care (Signed)

## 2022-12-19 NOTE — Progress Notes (Signed)
PHARMACIST - PHYSICIAN COMMUNICATION  CONCERNING: IV to Oral Route Change Policy  RECOMMENDATION: This patient is receiving pantoprazole by the intravenous route.  Based on criteria approved by the Pharmacy and Therapeutics Committee, the intravenous medication(s) is/are being converted to the equivalent oral dose form(s).  DESCRIPTION: These criteria include: The patient is eating (either orally or via tube) and/or has been taking other orally administered medications for a least 24 hours The patient has no evidence of active gastrointestinal bleeding or impaired GI absorption (gastrectomy, short bowel, patient on TNA or NPO).  If you have questions about this conversion, please contact the Pharmacy Department   Tressie Ellis, [facility] 12/19/2022 10:06 AM

## 2022-12-19 NOTE — TOC Progression Note (Addendum)
Transition of Care [facility]) - Progression Note    Patient Details  Name: Walter Tyler MRN: [id number] Date of Birth: 1956/01/20  Transition of Care [facility]) CM/SW Contact  Liliana Cline, LCSW Phone Number: 12/19/2022, 8:58 AM  Clinical Narrative:    [facility] unable to accept patient's insurance.  Reached out to the following: [facility] - Elnita Maxwell unable to accept patient's insurance. [facility] - awaiting response from [facility] - awaiting resposne from [facility] - Laurelyn Sickle unable to accept patient's insurance [facility] - Maralyn Sago unable to accept patient's insurance.    Expected Discharge Plan: Home w Home Health Services Barriers to Discharge: Continued Medical Work up  Expected Discharge Plan and Services       Living arrangements for the past 2 months: Single Family Home                                       Social Determinants of Health (SDOH) Interventions SDOH Screenings   Food Insecurity: No Food Insecurity (12/16/2022)  Housing: Patient Declined (12/16/2022)  Transportation Needs: No Transportation Needs (12/16/2022)  Utilities: Not At Risk (12/16/2022)  Alcohol Screen: Low Risk  (12/30/2020)  Depression (PHQ2-9): Low Risk  (12/31/2021)  Financial Resource Strain: Low Risk  (12/31/2021)  Physical Activity: Sufficiently Active (12/31/2021)  Social Connections: Socially Integrated (12/31/2021)  Stress: No Stress Concern Present (12/31/2021)  Tobacco Use: Low Risk  (12/17/2022)    Readmission Risk Interventions     No data to display

## 2022-12-20 LAB — GLUCOSE, CAPILLARY: Glucose-Capillary: 72 mg/dL (ref 70–99)

## 2022-12-20 MED ORDER — TRAMADOL HCL 50 MG PO TABS: 50.0000 mg | ORAL_TABLET | Freq: Four times a day (QID) | ORAL | Status: DC | PRN

## 2022-12-20 MED ORDER — ACETAMINOPHEN 500 MG PO TABS: 500.0000 mg | ORAL_TABLET | Freq: Four times a day (QID) | ORAL | Status: DC | PRN

## 2022-12-20 NOTE — TOC Progression Note (Signed)
Transition of Care [facility]) - Progression Note    Patient Details  Name: Walter Tyler MRN: [id number] Date of Birth: 08-Dec-1955  Transition of Care [facility]) CM/SW Contact  Margarito Liner, LCSW Phone Number: 12/20/2022, 11:31 AM  Clinical Narrative:  [facility], [facility], and [facility] are unable to accept referral. Sent secure chat to attending to let him know so that referral can be made to the ostomy clinic. CSW notified patient.   Expected Discharge Plan: Home w Home Health Services Barriers to Discharge: Continued Medical Work up  Expected Discharge Plan and Services       Living arrangements for the past 2 months: Single Family Home                                       Social Determinants of Health (SDOH) Interventions SDOH Screenings   Food Insecurity: No Food Insecurity (12/16/2022)  Housing: Patient Declined (12/16/2022)  Transportation Needs: No Transportation Needs (12/16/2022)  Utilities: Not At Risk (12/16/2022)  Alcohol Screen: Low Risk  (12/30/2020)  Depression (PHQ2-9): Low Risk  (12/31/2021)  Financial Resource Strain: Low Risk  (12/31/2021)  Physical Activity: Sufficiently Active (12/31/2021)  Social Connections: Socially Integrated (12/31/2021)  Stress: No Stress Concern Present (12/31/2021)  Tobacco Use: Low Risk  (12/17/2022)    Readmission Risk Interventions     No data to display

## 2022-12-20 NOTE — Telephone Encounter (Unsigned)
Copied from CRM [id number]. Topic: General - Inquiry >> Dec 20, 2022  3:08 PM Lennox Pippins wrote: Patient's wife called and stated she needed to speak to someone who does pre authorizations for a surgery patient had already had done, at [facility]. Patient's wife was on hold with [facility] for 25 minutes and got disconnected, please advise.

## 2022-12-20 NOTE — Progress Notes (Signed)
Subjective:  CC: Walter Tyler is a 67 y.o. male  Hospital stay day 4, 3 Days Post-Op ex-lap, hartmans for SBO and perforated colon  HPI: No acute issues overnight.  Pain controlled.  Ambulated some yesterday.  ROS:  General: Denies weight loss, weight gain, fatigue, fevers, chills, and night sweats. Heart: Denies chest pain, palpitations, racing heart, irregular heartbeat, leg pain or swelling, and decreased activity tolerance. Respiratory: Denies breathing difficulty, shortness of breath, wheezing, cough, and sputum. GI: Denies change in appetite, heartburn, nausea, vomiting, constipation, diarrhea, and blood in stool. GU: Denies difficulty urinating, pain with urinating, urgency, frequency, blood in urine.   Objective:   Temp:  [98.2 F (36.8 C)-98.8 F (37.1 C)] 98.7 F (37.1 C) (07/29 0449) Pulse Rate:  [55-56] 56 (07/29 0449) Resp:  [16-20] 20 (07/29 0449) BP: (123-137)/(74-83) 133/74 (07/29 0449) SpO2:  [96 %-97 %] 96 % (07/29 0449)     Height: 5\' 10"  (177.8 cm) Weight: 77.1 kg BMI (Calculated): 24.39   Intake/Output this shift:   Intake/Output Summary (Last 24 hours) at 12/20/2022 0748 Last data filed at 12/20/2022 0602 Gross per 24 hour  Intake 3115.42 ml  Output 800 ml  Net 2315.42 ml    Constitutional :  alert, cooperative, appears stated age, and no distress  Respiratory:  clear to auscultation bilaterally  Cardiovascular:  regular rate and rhythm  Gastrointestinal: Soft, still some distention noted, but minimal TTP. Ostomy pink, swelling some .   Skin: Cool and moist. Prevena vac intact.  Psychiatric: Normal affect, non-agitated, not confused       LABS:     Latest Ref Rng & Units 12/20/2022    4:09 AM 12/19/2022    4:08 AM 12/18/2022    6:39 AM  CMP  Glucose 70 - 99 mg/dL 69  962  952   BUN 8 - 23 mg/dL 19  27  16    Creatinine 0.61 - 1.24 mg/dL 8.41  3.24  4.01   Sodium 135 - 145 mmol/L 139  136  134   Potassium 3.5 - 5.1 mmol/L 3.8  4.5  4.7   Chloride  98 - 111 mmol/L 108  102  100   CO2 22 - 32 mmol/L 22  27  26    Calcium 8.9 - 10.3 mg/dL 8.0  7.8  8.0       Latest Ref Rng & Units 12/20/2022    4:09 AM 12/19/2022    4:08 AM 12/18/2022    6:39 AM  CBC  WBC 4.0 - 10.5 K/uL 8.5  8.2  7.8   Hemoglobin 13.0 - 17.0 g/dL 02.7  25.3  66.4   Hematocrit 39.0 - 52.0 % 34.3  35.8  39.6   Platelets 150 - 400 K/uL 260  232  218     RADS: N/a Assessment:   S/p exploratory laparotomy, hartmans for SBO and perforated colon.  Stable.  Labs reassuring.  Continue NG decompression, zosyn for intra-abdominal infection.  Encourage ambulation.  Gum/hard candy ok.  labs/images/medications/previous chart entries reviewed personally and relevant changes/updates noted above.

## 2022-12-20 NOTE — Plan of Care (Signed)

## 2022-12-20 NOTE — Progress Notes (Signed)
Initial Nutrition Assessment  DOCUMENTATION CODES:   Not applicable  INTERVENTION:   RD will add supplements with diet advancement   Recommend TPN if unable to advance pt's diet by 7/30  Pt at high refeed risk  Daily weights   NUTRITION DIAGNOSIS:   Inadequate oral intake related to altered GI function as evidenced by NPO status.  GOAL:   Patient will meet greater than or equal to 90% of their needs  MONITOR:   Diet advancement, Labs, Weight trends, Skin, I & O's  REASON FOR ASSESSMENT:   NPO/Clear Liquid Diet    ASSESSMENT:   67 y/o male with h/o BPH who is admitted with SBO, colon perforation and small bowel serosal injury s/p exploratory laparatomy (with lysis of adhesions, abdominal washout, sigmoidectomy with end colostomy (Hartmann's procedure) & small bowel serosal injury repair) 7/26 complicated by post op ileus.  Met with pt in room today. Pt reports that he is feeling better today; pt sitting up and in good spirits. Pt reports good appetite and oral intake at baseline. Pt reports that he is a runner and has a very regimented diet at home. Pt also drinks chocolate Ensure daily. Pt reports that he has not had any food since last Wednesday. Pt has remained NPO since admission an is now without adequate nutrition for > 5 days. Pt reports that he is starting to feel hungry today. Pt denies any BM or flatus yet but reports that he feels his stomach rumbling. NGT in place with ~461ml in canister. Pt denies any abdominal pain other than soreness around his incision. RD discussed with pt the importance of adequate nutrition needed to preserve lean muscle and support post op healing. Pt is willing to drink supplements with diet advancement. Would recommend TPN if unable to advance pt's diet by tomorrow. Pt is at high refeed risk. No new weight since admission; will request daily weights.     Medications reviewed and include: lovenox, protonix, NaCl @75ml /hr, zosyn  Labs  reviewed: K 3.8 wnl  NUTRITION - FOCUSED PHYSICAL EXAM:  Flowsheet Row Most Recent Value  Orbital Region No depletion  Upper Arm Region Moderate depletion  Thoracic and Lumbar Region No depletion  Buccal Region No depletion  Temple Region Mild depletion  Clavicle Bone Region Moderate depletion  Clavicle and Acromion Bone Region Moderate depletion  Scapular Bone Region No depletion  Dorsal Hand No depletion  Patellar Region No depletion  Anterior Thigh Region No depletion  Posterior Calf Region No depletion  Edema (RD Assessment) None  Hair Reviewed  Eyes Reviewed  Mouth Reviewed  Skin Reviewed  Nails Reviewed   Diet Order:   Diet Order             Diet NPO time specified Except for: Sips with Meds, Ice Chips  Diet effective now                  EDUCATION NEEDS:   Education needs have been addressed  Skin:  Skin Assessment: Reviewed RN Assessment (Incision abdomen, VAC)  Last BM:  7/25  Height:   Ht Readings from Last 1 Encounters:  12/16/22 5\' 10"  (1.778 m)    Weight:   Wt Readings from Last 1 Encounters:  12/16/22 77.1 kg    Ideal Body Weight:  75.45 kg  BMI:  Body mass index is 24.39 kg/m.  Estimated Nutritional Needs:   Kcal:  2000-2300kcal/day  Protein:  100-115g/day  Fluid:  1.9-2.2L/day  Betsey Holiday MS, RD, LDN Please  refer to [facility] for RD and/or RD on-call/weekend/after hours pager

## 2022-12-21 NOTE — Progress Notes (Signed)
Subjective:  CC: Walter Tyler is a 67 y.o. male  Hospital stay day 5, 4 Days Post-Op ex-lap, hartmans for SBO and perforated colon  HPI: No acute issues overnight.  Pain controlled.  Ambulated some yesterday. Having flatus.  ROS:  General: Denies weight loss, weight gain, fatigue, fevers, chills, and night sweats. Heart: Denies chest pain, palpitations, racing heart, irregular heartbeat, leg pain or swelling, and decreased activity tolerance. Respiratory: Denies breathing difficulty, shortness of breath, wheezing, cough, and sputum. GI: Denies change in appetite, heartburn, nausea, vomiting, constipation, diarrhea, and blood in stool. GU: Denies difficulty urinating, pain with urinating, urgency, frequency, blood in urine.   Objective:   Temp:  [98 F (36.7 C)-98.7 F (37.1 C)] 98 F (36.7 C) (07/30 0803) Pulse Rate:  [51-59] 51 (07/30 0803) Resp:  [16-20] 16 (07/30 0803) BP: (134-159)/(82-89) 157/89 (07/30 0803) SpO2:  [96 %-98 %] 98 % (07/30 0803) Weight:  [81.5 kg] 81.5 kg (07/30 0500)     Height: 5\' 10"  (177.8 cm) Weight: 81.5 kg BMI (Calculated): 25.78   Intake/Output this shift:   Intake/Output Summary (Last 24 hours) at 12/21/2022 0929 Last data filed at 12/21/2022 0835 Gross per 24 hour  Intake 900 ml  Output 1175 ml  Net -275 ml    Constitutional :  alert, cooperative, appears stated age, and no distress  Respiratory:  clear to auscultation bilaterally  Cardiovascular:  regular rate and rhythm  Gastrointestinal: Soft, still some distention noted, improved. minimal TTP. Ostomy pink, swelling some scant bowel output .   Skin: Cool and moist. Prevena vac intact.  Psychiatric: Normal affect, non-agitated, not confused       LABS:     Latest Ref Rng & Units 12/20/2022    4:09 AM 12/19/2022    4:08 AM 12/18/2022    6:39 AM  CMP  Glucose 70 - 99 mg/dL 69  629  528   BUN 8 - 23 mg/dL 19  27  16    Creatinine 0.61 - 1.24 mg/dL 4.13  2.44  0.10   Sodium 135 - 145 mmol/L  139  136  134   Potassium 3.5 - 5.1 mmol/L 3.8  4.5  4.7   Chloride 98 - 111 mmol/L 108  102  100   CO2 22 - 32 mmol/L 22  27  26    Calcium 8.9 - 10.3 mg/dL 8.0  7.8  8.0       Latest Ref Rng & Units 12/20/2022    4:09 AM 12/19/2022    4:08 AM 12/18/2022    6:39 AM  CBC  WBC 4.0 - 10.5 K/uL 8.5  8.2  7.8   Hemoglobin 13.0 - 17.0 g/dL 27.2  53.6  64.4   Hematocrit 39.0 - 52.0 % 34.3  35.8  39.6   Platelets 150 - 400 K/uL 260  232  218     RADS: N/a Assessment:   S/p exploratory laparotomy, hartmans for SBO and perforated colon.  Stable.  Labs reassuring.  Will proceed with clamp trial.  labs/images/medications/previous chart entries reviewed personally and relevant changes/updates noted above.

## 2022-12-21 NOTE — Consult Note (Signed)
WOC Nurse ostomy consult note Stoma type/location: LLQ, end colostomy Stomal assessment/size: 1 3/8" round, budded, pink, moist Peristomal assessment: intact  Treatment options for stomal/peristomal skin: 2" skin barrier ring  Output; liquid brown Ostomy pouching: 2pc. 2 3/4" with 2" skin barrier ring  Education provided:  Explained role of ostomy nurse and creation of stoma  Explained stoma characteristics (budded, flush, color, texture, care) Demonstrated pouch change (cutting new skin barrier, measuring stoma, cleaning peristomal skin and stoma, use of barrier ring) Education on emptying when 1/3 to 1/2 full and how to empty Demonstrated use of wick to clean spout  Discussed risk of peristomal hernia Provided patient with [employer] and marked items currently using Wife was not present for teaching session despite being aware, reported she had to address FLMA paperwork for the patient's employer.  Planning second teaching session with her and patient, he is aware and I wrote it down on educational materials left in the room Patient is very engaged to learn and be independent.   Enrolled patient in [place] Secure Start DC program: Yes  WOC Nurse will follow along with you for continued support with ostomy teaching and care Deema Juncaj [facility] MSN, RN, [place], CNS, [place] [phone]

## 2022-12-21 NOTE — Telephone Encounter (Signed)
Attempted to reach patient, no answer. Voice mail full

## 2022-12-21 NOTE — TOC Progression Note (Signed)
Transition of Care [facility]) - Progression Note    Patient Details  Name: Walter Tyler MRN: [id number] Date of Birth: 15-Apr-1956  Transition of Care [facility]) CM/SW Contact  Hetty Ely, RN Phone Number: 12/21/2022, 2:00 PM  Clinical Narrative:  Patient's wife called to report that she spoke with Insurance Co. And they will provide [facility] services, however additional paperwork is needed. CM spoke with [facility] [place], who consents to providing [facility] services for patient, MD and wife notified.    Expected Discharge Plan: Home w Home Health Services Barriers to Discharge: Continued Medical Work up  Expected Discharge Plan and Services       Living arrangements for the past 2 months: Single Family Home                                       Social Determinants of Health (SDOH) Interventions SDOH Screenings   Food Insecurity: No Food Insecurity (12/16/2022)  Housing: Patient Declined (12/16/2022)  Transportation Needs: No Transportation Needs (12/16/2022)  Utilities: Not At Risk (12/16/2022)  Alcohol Screen: Low Risk  (12/30/2020)  Depression (PHQ2-9): Low Risk  (12/31/2021)  Financial Resource Strain: Low Risk  (12/31/2021)  Physical Activity: Sufficiently Active (12/31/2021)  Social Connections: Socially Integrated (12/31/2021)  Stress: No Stress Concern Present (12/31/2021)  Tobacco Use: Low Risk  (12/17/2022)    Readmission Risk Interventions     No data to display

## 2022-12-21 NOTE — Plan of Care (Signed)
  Problem: Education: Goal: Knowledge of General Education information will improve Description: Including pain rating scale, medication(s)/side effects and non-pharmacologic comfort measures 12/21/2022 1759 by Orlene Plum, RN Outcome: Progressing 12/21/2022 1759 by Orlene Plum, RN Outcome: Progressing   Problem: Health Behavior/Discharge Planning: Goal: Ability to manage health-related needs will improve 12/21/2022 1759 by Orlene Plum, RN Outcome: Progressing 12/21/2022 1759 by Orlene Plum, RN Outcome: Progressing   Problem: Clinical Measurements: Goal: Ability to maintain clinical measurements within normal limits will improve 12/21/2022 1759 by Orlene Plum, RN Outcome: Progressing 12/21/2022 1759 by Orlene Plum, RN Outcome: Progressing Goal: Will remain free from infection 12/21/2022 1759 by Orlene Plum, RN Outcome: Progressing 12/21/2022 1759 by Orlene Plum, RN Outcome: Progressing Goal: Diagnostic test results will improve 12/21/2022 1759 by Orlene Plum, RN Outcome: Progressing 12/21/2022 1759 by Orlene Plum, RN Outcome: Progressing Goal: Respiratory complications will improve 12/21/2022 1759 by Orlene Plum, RN Outcome: Progressing 12/21/2022 1759 by Orlene Plum, RN Outcome: Progressing Goal: Cardiovascular complication will be avoided 12/21/2022 1759 by Orlene Plum, RN Outcome: Progressing 12/21/2022 1759 by Orlene Plum, RN Outcome: Progressing   Problem: Activity: Goal: Risk for activity intolerance will decrease 12/21/2022 1759 by Orlene Plum, RN Outcome: Progressing 12/21/2022 1759 by Orlene Plum, RN Outcome: Progressing   Problem: Nutrition: Goal: Adequate nutrition will be maintained 12/21/2022 1759 by Orlene Plum, RN Outcome: Progressing 12/21/2022 1759 by Orlene Plum, RN Outcome: Progressing   Problem: Coping: Goal: Level of anxiety will decrease 12/21/2022  1759 by Orlene Plum, RN Outcome: Progressing 12/21/2022 1759 by Orlene Plum, RN Outcome: Progressing   Problem: Elimination: Goal: Will not experience complications related to bowel motility 12/21/2022 1759 by Orlene Plum, RN Outcome: Progressing 12/21/2022 1759 by Orlene Plum, RN Outcome: Progressing Goal: Will not experience complications related to urinary retention 12/21/2022 1759 by Orlene Plum, RN Outcome: Progressing 12/21/2022 1759 by Orlene Plum, RN Outcome: Progressing   Problem: Pain Managment: Goal: General experience of comfort will improve 12/21/2022 1759 by Orlene Plum, RN Outcome: Progressing 12/21/2022 1759 by Orlene Plum, RN Outcome: Progressing   Problem: Safety: Goal: Ability to remain free from injury will improve 12/21/2022 1759 by Orlene Plum, RN Outcome: Progressing 12/21/2022 1759 by Orlene Plum, RN Outcome: Progressing   Problem: Skin Integrity: Goal: Risk for impaired skin integrity will decrease Outcome: Progressing   Pt ao x4, respirations even and unlabored on RA. Pt pain controlled well with scheduled meds. Pt NG tube removed this shift. Pt tolerating clear liquid diet without issue. Pt resting in bed and has no complaints at this time.

## 2022-12-22 MED ORDER — SIMETHICONE 80 MG PO CHEW
80.0000 mg | CHEWABLE_TABLET | Freq: Four times a day (QID) | ORAL | Status: DC
  Administered 2022-12-22 – 2022-12-24 (×6): 80 mg via ORAL
  Filled 2022-12-22 (×7): qty 1

## 2022-12-22 MED ORDER — ADULT MULTIVITAMIN W/MINERALS CH
1.0000 | ORAL_TABLET | Freq: Every day | ORAL | Status: DC
  Administered 2022-12-23 – 2022-12-25 (×3): 1 via ORAL
  Filled 2022-12-22 (×3): qty 1

## 2022-12-22 MED ORDER — ALUM & MAG HYDROXIDE-SIMETH 200-200-20 MG/5ML PO SUSP
30.0000 mL | Freq: Four times a day (QID) | ORAL | Status: DC | PRN
  Administered 2022-12-22 (×2): 30 mL via ORAL
  Filled 2022-12-22 (×2): qty 30

## 2022-12-22 MED ORDER — ENSURE ENLIVE PO LIQD
237.0000 mL | Freq: Three times a day (TID) | ORAL | Status: DC
  Administered 2022-12-22 – 2022-12-24 (×4): 237 mL via ORAL

## 2022-12-22 NOTE — Progress Notes (Signed)
Mobility Specialist - Progress Note   12/22/22 1447  Mobility  Activity Ambulated independently in room;Ambulated independently in hallway  Level of Assistance Modified independent, requires aide device or extra time  Assistive Device Front wheel walker  Distance Ambulated (ft) 180 ft  Activity Response Tolerated well  $Mobility charge 1 Mobility  Mobility Specialist Start Time (ACUTE ONLY) 1403  Mobility Specialist Stop Time (ACUTE ONLY) 1433  Mobility Specialist Time Calculation (min) (ACUTE ONLY) 30 min   Pt sitting EOB upon entry, utilizing RA. Pt STS indep and amb one lap around the NS ModI-- RW used for "comfort". Pt returned to the room, left seated EOB with needs within reach. RN notified .  Zetta Bills Mobility Specialist 12/22/22 2:49 PM

## 2022-12-22 NOTE — Progress Notes (Signed)
Subjective:  CC: Walter Tyler is a 67 y.o. male  Hospital stay day 6, 5 Days Post-Op ex-lap, hartmans for SBO and perforated colon  HPI: No acute issues overnight.  More stool in bag this am.  Pain controlled  ROS:  General: Denies weight loss, weight gain, fatigue, fevers, chills, and night sweats. Heart: Denies chest pain, palpitations, racing heart, irregular heartbeat, leg pain or swelling, and decreased activity tolerance. Respiratory: Denies breathing difficulty, shortness of breath, wheezing, cough, and sputum. GI: Denies change in appetite, heartburn, nausea, vomiting, constipation, diarrhea, and blood in stool. GU: Denies difficulty urinating, pain with urinating, urgency, frequency, blood in urine.   Objective:   Temp:  [98 F (36.7 C)-98.3 F (36.8 C)] 98 F (36.7 C) (07/31 0810) Pulse Rate:  [53-61] 54 (07/31 0810) Resp:  [18-20] 18 (07/31 0810) BP: (137-151)/(89-94) 137/93 (07/31 0810) SpO2:  [96 %-98 %] 98 % (07/31 0810)     Height: 5\' 10"  (177.8 cm) Weight: 81.5 kg BMI (Calculated): 25.78   Intake/Output this shift:   Intake/Output Summary (Last 24 hours) at 12/22/2022 1241 Last data filed at 12/22/2022 0800 Gross per 24 hour  Intake 300 ml  Output 950 ml  Net -650 ml    Constitutional :  alert, cooperative, appears stated age, and no distress  Respiratory:  clear to auscultation bilaterally  Cardiovascular:  regular rate and rhythm  Gastrointestinal: Soft, still some distention noted, improved. minimal TTP. Ostomy pink, swelling, stool noted in bag this am .   Skin: Cool and moist. Prevena vac intact.  Psychiatric: Normal affect, non-agitated, not confused       LABS:     Latest Ref Rng & Units 12/20/2022    4:09 AM 12/19/2022    4:08 AM 12/18/2022    6:39 AM  CMP  Glucose 70 - 99 mg/dL 69  657  846   BUN 8 - 23 mg/dL 19  27  16    Creatinine 0.61 - 1.24 mg/dL 9.62  9.52  8.41   Sodium 135 - 145 mmol/L 139  136  134   Potassium 3.5 - 5.1 mmol/L 3.8  4.5   4.7   Chloride 98 - 111 mmol/L 108  102  100   CO2 22 - 32 mmol/L 22  27  26    Calcium 8.9 - 10.3 mg/dL 8.0  7.8  8.0       Latest Ref Rng & Units 12/20/2022    4:09 AM 12/19/2022    4:08 AM 12/18/2022    6:39 AM  CBC  WBC 4.0 - 10.5 K/uL 8.5  8.2  7.8   Hemoglobin 13.0 - 17.0 g/dL 32.4  40.1  02.7   Hematocrit 39.0 - 52.0 % 34.3  35.8  39.6   Platelets 150 - 400 K/uL 260  232  218     RADS: N/a Assessment:   S/p exploratory laparotomy, hartmans for SBO and perforated colon.  Tolerating clears well.  Stool in bag.  Will advance to full liquid.  Simethicone around the clock to improve distention.  HH arranged for continued ostomy care.  labs/images/medications/previous chart entries reviewed personally and relevant changes/updates noted above.

## 2022-12-22 NOTE — Plan of Care (Signed)
  Problem: Health Behavior/Discharge Planning: Goal: Ability to manage health-related needs will improve Outcome: Progressing   Problem: Clinical Measurements: Goal: Ability to maintain clinical measurements within normal limits will improve Outcome: Progressing   Problem: Clinical Measurements: Goal: Will remain free from infection Outcome: Progressing   Problem: Clinical Measurements: Goal: Diagnostic test results will improve Outcome: Progressing   

## 2022-12-23 NOTE — Plan of Care (Signed)
  Problem: Clinical Measurements: Goal: Ability to maintain clinical measurements within normal limits will improve Outcome: Progressing   Problem: Clinical Measurements: Goal: Will remain free from infection Outcome: Progressing   Problem: Clinical Measurements: Goal: Diagnostic test results will improve Outcome: Progressing   Problem: Clinical Measurements: Goal: Will remain free from infection Outcome: Progressing   Problem: Clinical Measurements: Goal: Diagnostic test results will improve Outcome: Progressing   Problem: Clinical Measurements: Goal: Respiratory complications will improve Outcome: Progressing   Problem: Clinical Measurements: Goal: Cardiovascular complication will be avoided Outcome: Progressing   Problem: Activity: Goal: Risk for activity intolerance will decrease Outcome: Progressing   Problem: Nutrition: Goal: Adequate nutrition will be maintained Outcome: Progressing

## 2022-12-23 NOTE — Consult Note (Addendum)
WOC Nurse ostomy follow up Stoma type/location: LLQ, end colostomy  Stomal assessment/size: 1 3/8" round, budded, pink, moist  Peristomal assessment: intact Treatment options for stomal/peristomal skin: 2" skin barrier ring  Output:liquid brown Ostomy pouching: 2pc. 2 3/4" with 2" ostomy barrier ring  Education provided:  Met with patient and wife.  Allowed patient to verbalize all steps in pouch change. Wife cut new skin barrier. Patient with minimally cueing removed old pouch, WOC assisting with areas that are in contact with Prevena dressing.  Patient and WOC discussed cleansing his peristomal skin and the stoma. Wife is in the room but is not able to assist with this portion and becomes emotional. Patient is able to open and close lock and roll closure and has been emptying with assistance from staff. Today is he able to place the barrier ring around the stoma with minimal assistance and he was able to place new skin barrier with assistance just because he was sitting and not able to see as well. Allowed patient to attach new pouch to the skin barrier and he did this independently.  Wife is supportive Marked [facility] catelog with items he is using. Discussed use of transparent vs beige pouches and the use of filtered pouches. Patient is independent in "burping" gas from the pouch and able to verbalize using wick to clean spout.  Stressed importance of limiting activities that increase his intraabdominal pressure to reduce the risk of hernia. He is a very active gentleman prior to the surgery.  Discussed shower, diet, meds.   6 pouching sets/barrier rings in the patient room for DC to home  Enrolled patient in [employer] Start Discharge program: Yes  WOC nursing will follow up for teaching Monday 8/5 if still inpatient   Recommend [facility] for support with ostomy care/teaching.  Please send referral for ostomy outpatient clinic at the time of DC.   WOC Nurse will follow along with you for  continued support with ostomy teaching and care Kenta Laster [facility] MSN, RN, [place], CNS, [place] [phone]

## 2022-12-23 NOTE — Progress Notes (Signed)
Nutrition Follow Up Note   DOCUMENTATION CODES:   Not applicable  INTERVENTION:   Ensure Enlive po TID, each supplement provides 350 kcal and 20 grams of protein.  MVI po daily   Pt at high refeed risk; recommend monitor potassium, magnesium and phosphorus labs daily until stable  NUTRITION DIAGNOSIS:   Inadequate oral intake related to altered GI function as evidenced by NPO status. -resolved   GOAL:   Patient will meet greater than or equal to 90% of their needs -progressing   MONITOR:   PO intake, Supplement acceptance, Labs, Weight trends, I & O's, Skin  ASSESSMENT:   67 y/o male with h/o BPH who is admitted with SBO, colon perforation and small bowel serosal injury s/p exploratory laparatomy (with lysis of adhesions, abdominal washout, sigmoidectomy with end colostomy (Hartmann's procedure) & small bowel serosal injury repair) 7/26 complicated by post op ileus.  Pt able to be initiated on a diet 7/30; pt now advanced to a regular diet. Ensure added by RD 7/31; pt is drinking the Ensure. Pt documented to be eating sips and bites of meals. Pt is having a small amount of ostomy function. Simethicone initiated for abdominal distension. Pt remains wt refeed risk. Recommend continue supplements until post op healing is complete.    Medications reviewed and include: lovenox, protonix, simethicone   Labs reviewed: K 4.0 wnl, P 3.3 wnl, Mg 2.1 wnl  Diet Order:   Diet Order             Diet regular Room service appropriate? Yes; Fluid consistency: Thin  Diet effective now                  EDUCATION NEEDS:   Education needs have been addressed  Skin:  Skin Assessment: Reviewed RN Assessment (Incision abdomen, VAC)  Last BM:  8/1- small amount via ostomy  Height:   Ht Readings from Last 1 Encounters:  12/16/22 5\' 10"  (1.778 m)    Weight:   Wt Readings from Last 1 Encounters:  12/23/22 87.8 kg    Ideal Body Weight:  75.45 kg  BMI:  Body mass index is  27.77 kg/m.  Estimated Nutritional Needs:   Kcal:  2000-2300kcal/day  Protein:  100-115g/day  Fluid:  1.9-2.2L/day  Betsey Holiday MS, RD, LDN Please refer to [facility] for RD and/or RD on-call/weekend/after hours pager

## 2022-12-23 NOTE — Progress Notes (Signed)
Subjective:  CC: Walter Tyler is a 67 y.o. male  Hospital stay day 7, 6 Days Post-Op ex-lap, hartmans for SBO and perforated colon  HPI: No acute issues overnight.  Gas in bag more consistently now.  Verbal report of stool in bag as well.    ROS:  General: Denies weight loss, weight gain, fatigue, fevers, chills, and night sweats. Heart: Denies chest pain, palpitations, racing heart, irregular heartbeat, leg pain or swelling, and decreased activity tolerance. Respiratory: Denies breathing difficulty, shortness of breath, wheezing, cough, and sputum. GI: Denies change in appetite, heartburn, nausea, vomiting, constipation, diarrhea, and blood in stool. GU: Denies difficulty urinating, pain with urinating, urgency, frequency, blood in urine.   Objective:   Temp:  [98.3 F (36.8 C)-98.8 F (37.1 C)] 98.3 F (36.8 C) (08/01 0805) Pulse Rate:  [59-61] 60 (08/01 0805) Resp:  [18] 18 (08/01 0805) BP: (145-157)/(95-99) 145/98 (08/01 0805) SpO2:  [97 %-99 %] 99 % (08/01 0805) Weight:  [87.8 kg] 87.8 kg (08/01 0428)     Height: 5\' 10"  (177.8 cm) Weight: 87.8 kg BMI (Calculated): 27.77   Intake/Output this shift:   Intake/Output Summary (Last 24 hours) at 12/23/2022 1507 Last data filed at 12/23/2022 0920 Gross per 24 hour  Intake 1000.75 ml  Output 810 ml  Net 190.75 ml    Constitutional :  alert, cooperative, appears stated age, and no distress  Respiratory:  clear to auscultation bilaterally  Cardiovascular:  regular rate and rhythm  Gastrointestinal: Soft, but increased distention today.  No TTP. Ostomy pink, patent, just changed so no stool in bag  Skin: Cool and moist. Prevena vac intact.  Psychiatric: Normal affect, non-agitated, not confused       LABS:     Latest Ref Rng & Units 12/23/2022    5:07 AM 12/20/2022    4:09 AM 12/19/2022    4:08 AM  CMP  Glucose 70 - 99 mg/dL 161  69  096   BUN 8 - 23 mg/dL 8  19  27    Creatinine 0.61 - 1.24 mg/dL 0.45  4.09  8.11   Sodium 135  - 145 mmol/L 135  139  136   Potassium 3.5 - 5.1 mmol/L 4.0  3.8  4.5   Chloride 98 - 111 mmol/L 104  108  102   CO2 22 - 32 mmol/L 22  22  27    Calcium 8.9 - 10.3 mg/dL 8.3  8.0  7.8       Latest Ref Rng & Units 12/20/2022    4:09 AM 12/19/2022    4:08 AM 12/18/2022    6:39 AM  CBC  WBC 4.0 - 10.5 K/uL 8.5  8.2  7.8   Hemoglobin 13.0 - 17.0 g/dL 91.4  78.2  95.6   Hematocrit 39.0 - 52.0 % 34.3  35.8  39.6   Platelets 150 - 400 K/uL 260  232  218     RADS: N/a Assessment:   S/p exploratory laparotomy, hartmans for SBO and perforated colon.  Trial of full liquid but has obvious increased distention today.  Pt now admits having episodes of bloating prior to some improvement with gas in colostomy bag.  Will continue full liquid until distention better.  Also stated no carbonated drinks  HH arranged for continued ostomy care.  labs/images/medications/previous chart entries reviewed personally and relevant changes/updates noted above.

## 2022-12-23 NOTE — Plan of Care (Signed)
  Problem: Clinical Measurements: Goal: Will remain free from infection Outcome: Progressing   Problem: Clinical Measurements: Goal: Diagnostic test results will improve Outcome: Progressing   Problem: Activity: Goal: Risk for activity intolerance will decrease Outcome: Progressing   Problem: Nutrition: Goal: Adequate nutrition will be maintained Outcome: Progressing   Problem: Coping: Goal: Level of anxiety will decrease Outcome: Progressing   Problem: Elimination: Goal: Will not experience complications related to bowel motility Outcome: Progressing   Problem: Pain Managment: Goal: General experience of comfort will improve Outcome: Progressing

## 2022-12-24 NOTE — Plan of Care (Signed)

## 2022-12-24 NOTE — Progress Notes (Signed)
Subjective:  CC: Walter Tyler is a 67 y.o. male  Hospital stay day 8, 7 Days Post-Op ex-lap, hartmans for SBO and perforated colon  HPI: No acute issues overnight.  Gas and stool in bag this am.  Decreased bloating after decreasing carbonation intake  ROS:  General: Denies weight loss, weight gain, fatigue, fevers, chills, and night sweats. Heart: Denies chest pain, palpitations, racing heart, irregular heartbeat, leg pain or swelling, and decreased activity tolerance. Respiratory: Denies breathing difficulty, shortness of breath, wheezing, cough, and sputum. GI: Denies change in appetite, heartburn, nausea, vomiting, constipation, diarrhea, and blood in stool. GU: Denies difficulty urinating, pain with urinating, urgency, frequency, blood in urine.   Objective:   Temp:  [98.1 F (36.7 C)-98.3 F (36.8 C)] 98.3 F (36.8 C) (08/02 1509) Pulse Rate:  [59-62] 59 (08/02 1509) Resp:  [16-18] 16 (08/02 1509) BP: (130-147)/(88-99) 147/88 (08/02 1509) SpO2:  [97 %-99 %] 97 % (08/02 1509) Weight:  [87.7 kg] 87.7 kg (08/02 0500)     Height: 5\' 10"  (177.8 cm) Weight: 87.7 kg BMI (Calculated): 27.74   Intake/Output this shift:   Intake/Output Summary (Last 24 hours) at 12/24/2022 1844 Last data filed at 12/24/2022 0825 Gross per 24 hour  Intake 240 ml  Output 1245 ml  Net -1005 ml    Constitutional :  alert, cooperative, appears stated age, and no distress  Respiratory:  clear to auscultation bilaterally  Cardiovascular:  regular rate and rhythm  Gastrointestinal: Soft, decreased distention today.  No TTP. Ostomy pink, patent, stool and gas in bag now.  Skin: Cool and moist. Prevena vac intact.  Psychiatric: Normal affect, non-agitated, not confused       LABS:     Latest Ref Rng & Units 12/23/2022    5:07 AM 12/20/2022    4:09 AM 12/19/2022    4:08 AM  CMP  Glucose 70 - 99 mg/dL 191  69  478   BUN 8 - 23 mg/dL 8  19  27    Creatinine 0.61 - 1.24 mg/dL 2.95  6.21  3.08   Sodium 135 -  145 mmol/L 135  139  136   Potassium 3.5 - 5.1 mmol/L 4.0  3.8  4.5   Chloride 98 - 111 mmol/L 104  108  102   CO2 22 - 32 mmol/L 22  22  27    Calcium 8.9 - 10.3 mg/dL 8.3  8.0  7.8       Latest Ref Rng & Units 12/20/2022    4:09 AM 12/19/2022    4:08 AM 12/18/2022    6:39 AM  CBC  WBC 4.0 - 10.5 K/uL 8.5  8.2  7.8   Hemoglobin 13.0 - 17.0 g/dL 65.7  84.6  96.2   Hematocrit 39.0 - 52.0 % 34.3  35.8  39.6   Platelets 150 - 400 K/uL 260  232  218     RADS: N/a Assessment:   S/p exploratory laparotomy, hartmans for SBO and perforated colon.  Reg diet today.  Hopefully home in am with prevena vac in place, if battery still left.  No need for home abx since no sign of post op infection.  HH arranged for continued ostomy care.  F/u one week in office for possible staple removal  labs/images/medications/previous chart entries reviewed personally and relevant changes/updates noted above.

## 2022-12-24 NOTE — Discharge Instructions (Signed)
Laparoscopic Colectomy, Care After This sheet gives you information about how to care for yourself after your procedure. Your health care provider may also give you more specific instructions. If you have problems or questions, contact your health care provider. What can I expect after the procedure? After your procedure, it is common to have the following: Pain in your abdomen, especially in the incision areas. You will be given medicine to control the pain. Tiredness. This is a normal part of the recovery process. Your energy level will return to normal over the next several weeks. Changes in your bowel movements, such as constipation or needing to go more often. Talk with your health care provider about how to manage this. Follow these instructions at home: Medicines  tylenol and advil as needed for discomfort.  Please alternate between the two every four hours as needed for pain.    Use narcotics, if prescribed, only when tylenol and motrin is not enough to control pain.  325-650mg  every 8hrs to max of 4000mg /24hrs (including the 325mg  in every norco dose) for the tylenol.    Advil up to 800mg  per dose every 8hrs as needed for pain.   Do not drive or use heavy machinery while taking prescription pain medicine. Do not drink alcohol while taking prescription pain medicine. If you were prescribed an antibiotic medicine, use it as told by your health care provider. Do not stop using the antibiotic even if you start to feel better. Incision care    Follow instructions from your health care provider about how to take care of your incision areas. Make sure you: Keep your incisions clean and dry. Wash your hands with soap and water before and after applying medicine to the areas, and before and after changing your bandage (dressing). If soap and water are not available, use hand sanitizer. Change your dressing as told by your health care provider. Leave stitches (sutures), skin glue, or adhesive  strips in place. These skin closures may need to stay in place for 2 weeks or longer. If adhesive strip edges start to loosen and curl up, you may trim the loose edges. Do not remove adhesive strips completely unless your health care provider tells you to do that. Do not wear tight clothing over the incisions. Tight clothing may rub and irritate the incision areas, which may cause the incisions to open. Do not take baths, swim, or use a hot tub until your health care provider approves. OK TO SHOWER.   OK TO REMOVE MIDLINE SPONGE DRESSING OVER STAPLE LINE WHEN BATTERY RUNS OUT CHANGE OSTOMY AS PREVIOUSLY INSTRUCTED Activity Avoid lifting anything that is heavier than 10 lb (4.5 kg) for 2 weeks or until your health care provider says it is okay. You may resume normal activities as told by your health care provider. Ask your health care provider what activities are safe for you. Take rest breaks during the day as needed. Eating and drinking Follow instructions from your health care provider about what you can eat after surgery. To prevent or treat constipation while you are taking prescription pain medicine, your health care provider may recommend that you: Drink enough fluid to keep your urine clear or pale yellow. Take over-the-counter or prescription medicines. Eat foods that are high in fiber, such as fresh fruits and vegetables, whole grains, and beans. Limit foods that are high in fat and processed sugars, such as fried and sweet foods. General instructions Ask your health care provider when you will need an appointment  to get your sutures or staples removed. Keep all follow-up visits as told by your health care provider. This is important. Contact a health care provider if: You have more redness, swelling, or pain around your incisions. You have more fluid or blood coming from the incisions. Your incisions feel warm to the touch. You have pus or a bad smell coming from your incisions or  your dressing. You have a fever. You have an incision that breaks open (edges not staying together) after sutures or staples have been removed. Get help right away if: You develop a rash. You have chest pain or difficulty breathing. You have pain or swelling in your legs. You feel light-headed or you faint. Your abdomen swells (becomes distended). You have nausea or vomiting. You have blood in your stool (feces). This information is not intended to replace advice given to you by your health care provider. Make sure you discuss any questions you have with your health care provider. Document Released: 11/27/2004 Document Revised: 01/27/2018 Document Reviewed: 02/09/2016 Elsevier Interactive Patient Education  2019 [employer].

## 2022-12-24 NOTE — Plan of Care (Signed)

## 2022-12-25 MED ORDER — HYDROCODONE-ACETAMINOPHEN 5-325 MG PO TABS: 1.0000 | ORAL_TABLET | ORAL | 0 refills | Status: DC | PRN

## 2022-12-25 NOTE — Plan of Care (Signed)
Patient is adequate for discharge. Resolving plan of care.  Walter Tyler  

## 2022-12-25 NOTE — Progress Notes (Signed)
IV's removed without complications. Discharge instructions reviewed. Script given to the patient. Volunteers wheeled patient to the medical mall exit to be discharged to the care of family, in stable condition.  Cornell Barman Nuriyah Hanline

## 2022-12-25 NOTE — Plan of Care (Signed)
  Problem: Education: Goal: Knowledge of General Education information will improve Description: Including pain rating scale, medication(s)/side effects and non-pharmacologic comfort measures Outcome: Progressing   Problem: Health Behavior/Discharge Planning: Goal: Ability to manage health-related needs will improve Outcome: Progressing   Problem: Activity: Goal: Risk for activity intolerance will decrease Outcome: Progressing   Problem: Nutrition: Goal: Adequate nutrition will be maintained Outcome: Progressing   Problem: Elimination: Goal: Will not experience complications related to bowel motility Outcome: Progressing Goal: Will not experience complications related to urinary retention Outcome: Progressing   Problem: Pain Managment: Goal: General experience of comfort will improve Outcome: Progressing   

## 2022-12-26 NOTE — Discharge Summary (Signed)
  Patient ID: Walter Tyler MRN: [id number] DOB/AGE: [birth year] 67 y.o.  Admit date: 12/16/2022 Discharge date: 12/26/2022   Discharge Diagnoses:  Principal Problem:   SBO (small bowel obstruction) (HCC) Active Problems:   Perforation of sigmoid colon [facility])   Procedures:Hartmann's procedure  Hospital Course:  67 yo male presented to the ER w High grade SBO , initially admitted, resuscitated and decompressed with NGT, Unfortunately his condition deteriorated and Dr Tonna Boehringer took him promptly for Ex Laparotomy. Intra-op purulent peritonitis was found as well as sigmoid perforation, he underwent a HArtmann's. Patient was kept until he regained bowel function and the NG was dc and diet advanced.  At  The time of discharge the patient was ambulating,  pain was controlled.  His vital signs were stable and she was afebrile.   physical exam at discharge showed a pt  in no acute distress.  Awake and alert.  Abdomen: Soft incisions healing well with prevena in place w/o leaks without infection or peritonitis.  Extremities well-perfused and no edema.  Condition of the patient the time of discharge was stable    Disposition: Discharge disposition: 01-Home or Self Care       Discharge Instructions     Call MD for:  difficulty breathing, headache or visual disturbances   Complete by: As directed    Call MD for:  extreme fatigue   Complete by: As directed    Call MD for:  hives   Complete by: As directed    Call MD for:  persistant dizziness or light-headedness   Complete by: As directed    Call MD for:  persistant nausea and vomiting   Complete by: As directed    Call MD for:  redness, tenderness, or signs of infection (pain, swelling, redness, odor or green/yellow discharge around incision site)   Complete by: As directed    Call MD for:  severe uncontrolled pain   Complete by: As directed    Call MD for:  temperature >100.4   Complete by: As directed    Diet - low sodium heart healthy    Complete by: As directed    Increase activity slowly   Complete by: As directed    Lifting restrictions   Complete by: As directed    20 lbs x 5 weeks      Allergies as of 12/25/2022   No Known Allergies      Medication List     STOP taking these medications    Fluocinolone Acetonide Scalp 0.01 % Oil   tadalafil 5 MG tablet Commonly known as: CIALIS       TAKE these medications    HYDROcodone-acetaminophen 5-325 MG tablet Commonly known as: NORCO/VICODIN Take 1-2 tablets by mouth every 4 (four) hours as needed for moderate pain.   minoxidil 2.5 MG tablet Commonly known as: LONITEN Take 1.25 mg by mouth 2 (two) times daily.   MULTIVITAMIN ADULTS 50+ PO Take by mouth.        Follow-up Information     [facility] Follow up.   Why: They will follow up with you for your home health needs. Start of care Monday 8/5.        Sakai, Isami, DO Follow up in 1 week(s).   Specialties: General Surgery, Surgery Why: post op colon resection Contact information: [address] [place] [address] [phone]                  Sterling Big, MD FACS

## 2022-12-28 ENCOUNTER — Telehealth: Payer: Self-pay | Admitting: Family Medicine

## 2022-12-28 NOTE — Telephone Encounter (Unsigned)
Copied from CRM [id number]. Topic: Quick Communication - Home Health Verbal Orders >> Dec 28, 2022  3:43 PM Ja-Kwan M wrote: Caller/Agency: Elnita Maxwell with [facility]  Callback Number: [phone] Requesting OT/PT/Skilled Nursing/Social Work/Speech Therapy: skilled nursing - colostomy education Frequency: 2 x 3

## 2022-12-29 NOTE — Telephone Encounter (Signed)
Pt states he will need to call back once he hears from antoher5 dr about removing his stitiches.

## 2023-01-05 ENCOUNTER — Encounter: Payer: BC Managed Care – PPO | Admitting: Family Medicine

## 2023-01-18 DIAGNOSIS — Z433 Encounter for attention to colostomy: Secondary | ICD-10-CM

## 2023-01-18 DIAGNOSIS — N400 Enlarged prostate without lower urinary tract symptoms: Secondary | ICD-10-CM

## 2023-01-18 DIAGNOSIS — Z48815 Encounter for surgical aftercare following surgery on the digestive system: Secondary | ICD-10-CM

## 2023-02-02 ENCOUNTER — Telehealth: Payer: Self-pay | Admitting: Family Medicine

## 2023-02-02 NOTE — Telephone Encounter (Signed)
Gregary Signs with [facility] is calling to get verbal orders for pt Walter Tyler.   Please advise.   [phone]

## 2023-02-16 ENCOUNTER — Encounter: Payer: Self-pay | Admitting: Surgery

## 2023-04-19 ENCOUNTER — Encounter: Payer: BC Managed Care – PPO | Admitting: Family Medicine

## 2023-05-02 NOTE — Progress Notes (Signed)
Name: Walter Tyler   MRN: [id number]    DOB: 02/28/1956   Date:05/19/2023       Progress Note  Subjective  Chief Complaint  Chief Complaint  Patient presents with   Annual Exam    HPI  Patient presents for annual CPE .   Diet: changed due to colostomy bag, changed his diet Exercise: he is still doing his regular activity  Last Dental Exam: up to date Last Eye Exam:  up to date  Depression: phq 9 is negative    05/19/2023   11:06 AM 12/31/2021    7:40 AM 03/27/2021   10:14 AM 12/30/2020    7:41 AM 12/28/2019    7:51 AM  Depression screen PHQ 2/9  Decreased Interest 0 0 0 0 0  Down, Depressed, Hopeless 0 0 0 0 0  PHQ - 2 Score 0 0 0 0 0  Altered sleeping 0 0 0  0  Tired, decreased energy 0 0 0  0  Change in appetite 0 0 0  0  Feeling bad or failure about yourself  0 0 0  0  Trouble concentrating 0 0 0  0  Moving slowly or fidgety/restless 0 0 0  0  Suicidal thoughts 0 0 0  0  PHQ-9 Score 0 0 0  0  Difficult doing work/chores Not difficult at all        Hypertension:  BP Readings from Last 3 Encounters:  05/19/23 138/76  12/25/22 (!) 146/94  12/16/22 134/83    Obesity: Wt Readings from Last 3 Encounters:  05/19/23 163 lb 14.4 oz (74.3 kg)  12/25/22 180 lb 12.4 oz (82 kg)  12/31/21 163 lb (73.9 kg)   BMI Readings from Last 3 Encounters:  05/19/23 23.52 kg/m  12/25/22 25.94 kg/m  12/31/21 23.39 kg/m     Flowsheet Row Office Visit from 05/19/2023 in [facility]  AUDIT-C Score 0       Married STD testing and prevention (HIV/chl/gon/syphilis):  not applicable Sexual history:  Hep C Screening: completed Skin cancer: Discussed monitoring for atypical lesions Colorectal cancer: he wants to hold off since, having colostomy reversal in the Spring Prostate cancer:  yes Lab Results  Component Value Date   PSA 1.04 12/31/2021   PSA 0.84 12/30/2020   PSA 0.7 12/28/2019     Lung cancer:  Low Dose CT Chest recommended if Age  93-80 years, 30 pack-year currently smoking OR have quit w/in 15years. Patient  is not a candidate for screening   AAA: The USPSTF recommends one-time screening with ultrasonography in men ages 11 to 75 years who have ever smoked. Patient   is not a candidate for screening  ECG:  2024   Vaccines:  RSV: declined HPV: not applicable Tdap: completed Shingrix: completed Pneumonia: completed Flu: declined COVID-19: completed  Advanced Care Planning: A voluntary discussion about advance care planning including the explanation and discussion of advance directives.  Discussed health care proxy and Living will, and the patient was able to identify a health care proxy as wife .  Patient does not have a living will and power of attorney of health care   Patient Active Problem List   Diagnosis Date Noted   Colostomy in place [facility]) 05/19/2023   Perforation of sigmoid colon (HCC) 12/18/2022   History of small bowel obstruction 12/16/2022   Benign prostatic hyperplasia without lower urinary tract symptoms 12/31/2021   History of fracture due to fall 03/07/2012    Past Surgical  History:  Procedure Laterality Date   LAPAROTOMY N/A 12/17/2022   Procedure: EXPLORATORY LAPAROTOMY;  Surgeon: Sung Amabile, DO;  Location: [facility] ORS;  Service: General;  Laterality: N/A;   WRIST SURGERY Right 03/07/2012    Family History  Problem Relation Age of Onset   Diabetes Mother    Heart disease Mother    Diabetes Father     Social History   Socioeconomic History   Marital status: Married    Spouse name: Liborio Nixon   Number of children: 0   Years of education: Not on file   Highest education level: Bachelor's degree (e.g., BA, AB, BS)  Occupational History   Occupation: cycle counter   Tobacco Use   Smoking status: Never   Smokeless tobacco: Never  Vaping Use   Vaping status: Never Used  Substance and Sexual Activity   Alcohol use: Not Currently    Alcohol/week: 2.0 standard drinks of alcohol     Types: 2 Shots of liquor per week   Drug use: No   Sexual activity: Yes    Partners: Female    Birth control/protection: None  Other Topics Concern   Not on file  Social History Narrative   Not on file   Social Drivers of Health   Financial Resource Strain: Low Risk  (05/19/2023)   Overall Financial Resource Strain (CARDIA)    Difficulty of Paying Living Expenses: Not hard at all  Food Insecurity: No Food Insecurity (12/16/2022)   Hunger Vital Sign    Worried About Running Out of Food in the Last Year: Never true    Ran Out of Food in the Last Year: Never true  Transportation Needs: No Transportation Needs (12/16/2022)   PRAPARE - Administrator, Civil Service (Medical): No    Lack of Transportation (Non-Medical): No  Physical Activity: Insufficiently Active (05/19/2023)   Exercise Vital Sign    Days of Exercise per Week: 6 days    Minutes of Exercise per Session: 20 min  Stress: No Stress Concern Present (05/19/2023)   [employer] of Occupational Health - Occupational Stress Questionnaire    Feeling of Stress : Not at all  Social Connections: Socially Integrated (12/31/2021)   Social Connection and Isolation Panel [NHANES]    Frequency of Communication with Friends and Family: Once a week    Frequency of Social Gatherings with Friends and Family: More than three times a week    Attends Religious Services: More than 4 times per year    Active Member of [employer] or Organizations: Yes    Attends Banker Meetings: 1 to 4 times per year    Marital Status: Married  Catering manager Violence: Not At Risk (12/16/2022)   Humiliation, Afraid, Rape, and Kick questionnaire    Fear of Current or Ex-Partner: No    Emotionally Abused: No    Physically Abused: No    Sexually Abused: No     Current Outpatient Medications:    Multiple Vitamins-Minerals (MULTIVITAMIN ADULTS 50+ PO), Take by mouth., Disp: , Rfl:   No Known Allergies   ROS  Constitutional:  Negative for fever or weight change.  Respiratory: Negative for cough and shortness of breath.   Cardiovascular: Negative for chest pain or palpitations.  Gastrointestinal: Negative for abdominal pain, no bowel changes.  Musculoskeletal: Negative for gait problem or joint swelling.  Skin: Negative for rash.  Neurological: Negative for dizziness or headache.  No other specific complaints in a complete review of systems (except as listed  in HPI above).    Objective  Vitals:   05/19/23 1110  BP: 138/76  Pulse: (!) 51  Resp: 16  Temp: 97.8 F (36.6 C)  TempSrc: Oral  SpO2: 99%  Weight: 163 lb 14.4 oz (74.3 kg)  Height: 5\' 10"  (1.778 m)    Body mass index is 23.52 kg/m.  Physical Exam  Constitutional: Patient appears well-developed and well-nourished. No distress.  HENT: Head: Normocephalic and atraumatic. Ears: B TMs ok, no erythema or effusion; Nose: Nose normal. Mouth/Throat: Oropharynx is clear and moist. No oropharyngeal exudate.  Eyes: Conjunctivae and EOM are normal. Pupils are equal, round, and reactive to light. No scleral icterus.  Neck: Normal range of motion. Neck supple. No JVD present. No thyromegaly present.  Cardiovascular: Normal rate, regular rhythm and normal heart sounds.  No murmur heard. No BLE edema. Pulmonary/Chest: Effort normal and breath sounds normal. No respiratory distress. Abdominal: Soft. Bowel sounds are normal, no distension. There is no tenderness.coloscopy bag LLQ MALE GENITALIA: Normal descended testes bilaterally, no masses palpated, no hernias, no lesions, no discharge RECTAL: Prostate normal size and consistency, no rectal masses or hemorrhoids Musculoskeletal: Normal range of motion, no joint effusions. No gross deformities Neurological: he is alert and oriented to person, place, and time. No cranial nerve deficit. Coordination, balance, strength, speech and gait are normal.  Skin: Skin is warm and dry. No rash noted. No erythema.   Psychiatric: Patient has a normal mood and affect. behavior is normal. Judgment and thought content normal.     Assessment & Plan  1. Well adult exam (Primary)  - PSA - Lipid panel - CBC with Differential/Platelet - COMPLETE METABOLIC PANEL WITH GFR - Hemoglobin A1c  2. History of diverticulitis of colon  Doing well   3. Colostomy in place [facility])  Under the care of Dr. Tonna Boehringer  4. Screening for prostate cancer  - PSA  5. Benign prostatic hyperplasia without lower urinary tract symptoms  - PSA  6. Diabetes mellitus screening  - Hemoglobin A1c  7. Lipid screening  - Lipid panel     -Prostate cancer screening and PSA options (with potential risks and benefits of testing vs not testing) were discussed along with recent recs/guidelines. -USPSTF grade A and B recommendations reviewed with patient; age-appropriate recommendations, preventive care, screening tests, etc discussed and encouraged; healthy living encouraged; see AVS for patient education given to patient -Discussed importance of 150 minutes of physical activity weekly, eat two servings of fish weekly, eat one serving of tree nuts ( cashews, pistachios, pecans, almonds.Marland Kitchen) every other day, eat 6 servings of fruit/vegetables daily and drink plenty of water and avoid sweet beverages.  -Reviewed Health Maintenance: yes

## 2023-05-19 ENCOUNTER — Ambulatory Visit (INDEPENDENT_AMBULATORY_CARE_PROVIDER_SITE_OTHER): Payer: PRIVATE HEALTH INSURANCE | Admitting: Family Medicine

## 2023-05-19 ENCOUNTER — Encounter: Payer: Self-pay | Admitting: Family Medicine

## 2023-05-19 VITALS — BP 138/76 | HR 51 | Temp 97.8°F | Resp 16 | Ht 70.0 in | Wt 163.9 lb

## 2023-05-19 DIAGNOSIS — Z131 Encounter for screening for diabetes mellitus: Secondary | ICD-10-CM

## 2023-05-19 DIAGNOSIS — Z0001 Encounter for general adult medical examination with abnormal findings: Secondary | ICD-10-CM | POA: Diagnosis not present

## 2023-05-19 DIAGNOSIS — Z125 Encounter for screening for malignant neoplasm of prostate: Secondary | ICD-10-CM

## 2023-05-19 DIAGNOSIS — Z8719 Personal history of other diseases of the digestive system: Secondary | ICD-10-CM | POA: Diagnosis not present

## 2023-05-19 DIAGNOSIS — Z0000 Encounter for general adult medical examination without abnormal findings: Secondary | ICD-10-CM

## 2023-05-19 DIAGNOSIS — Z1211 Encounter for screening for malignant neoplasm of colon: Secondary | ICD-10-CM

## 2023-05-19 DIAGNOSIS — Z933 Colostomy status: Secondary | ICD-10-CM | POA: Diagnosis not present

## 2023-05-19 DIAGNOSIS — Z13220 Encounter for screening for lipoid disorders: Secondary | ICD-10-CM

## 2023-05-19 DIAGNOSIS — N400 Enlarged prostate without lower urinary tract symptoms: Secondary | ICD-10-CM

## 2023-05-19 DIAGNOSIS — Z23 Encounter for immunization: Secondary | ICD-10-CM

## 2023-05-20 LAB — CBC WITH DIFFERENTIAL/PLATELET
Absolute Lymphocytes: 1570 {cells}/uL (ref 850–3900)
Absolute Monocytes: 335 {cells}/uL (ref 200–950)
Basophils Absolute: 30 {cells}/uL (ref 0–200)
Basophils Relative: 0.7 %
Eosinophils Absolute: 22 {cells}/uL (ref 15–500)
Eosinophils Relative: 0.5 %
HCT: 40.2 % (ref 38.5–50.0)
Hemoglobin: 13.3 g/dL (ref 13.2–17.1)
MCH: 30.2 pg (ref 27.0–33.0)
MCHC: 33.1 g/dL (ref 32.0–36.0)
MCV: 91.2 fL (ref 80.0–100.0)
MPV: 10.5 fL (ref 7.5–12.5)
Monocytes Relative: 7.8 %
Neutro Abs: 2344 {cells}/uL (ref 1500–7800)
Neutrophils Relative %: 54.5 %
Platelets: 189 10*3/uL (ref 140–400)
RBC: 4.41 10*6/uL (ref 4.20–5.80)
RDW: 12.9 % (ref 11.0–15.0)
Total Lymphocyte: 36.5 %
WBC: 4.3 10*3/uL (ref 3.8–10.8)

## 2023-05-20 LAB — COMPLETE METABOLIC PANEL WITH GFR
AG Ratio: 1.6 (calc) (ref 1.0–2.5)
ALT: 18 U/L (ref 9–46)
AST: 24 U/L (ref 10–35)
Albumin: 4.3 g/dL (ref 3.6–5.1)
Alkaline phosphatase (APISO): 41 U/L (ref 35–144)
BUN: 10 mg/dL (ref 7–25)
CO2: 29 mmol/L (ref 20–32)
Calcium: 9.4 mg/dL (ref 8.6–10.3)
Chloride: 105 mmol/L (ref 98–110)
Creat: 0.83 mg/dL (ref 0.70–1.35)
Globulin: 2.7 g/dL (ref 1.9–3.7)
Glucose, Bld: 87 mg/dL (ref 65–99)
Potassium: 3.9 mmol/L (ref 3.5–5.3)
Sodium: 142 mmol/L (ref 135–146)
Total Bilirubin: 0.6 mg/dL (ref 0.2–1.2)
Total Protein: 7 g/dL (ref 6.1–8.1)
eGFR: 96 mL/min/{1.73_m2} (ref >=60)

## 2023-05-20 LAB — HEMOGLOBIN A1C
Hgb A1c MFr Bld: 5.7 %{Hb} — ABNORMAL HIGH (ref <5.7)
Mean Plasma Glucose: 117 mg/dL
eAG (mmol/L): 6.5 mmol/L

## 2023-05-20 LAB — LIPID PANEL
Cholesterol: 194 mg/dL (ref <200)
HDL: 58 mg/dL (ref >=40)
LDL Cholesterol (Calc): 117 mg/dL — ABNORMAL HIGH
Non-HDL Cholesterol (Calc): 136 mg/dL — ABNORMAL HIGH (ref <130)
Total CHOL/HDL Ratio: 3.3 (calc) (ref <5.0)
Triglycerides: 87 mg/dL (ref <150)

## 2023-05-20 LAB — PSA: PSA: 0.69 ng/mL (ref <=4.00)

## 2023-06-27 ENCOUNTER — Ambulatory Visit: Payer: Self-pay | Admitting: Surgery

## 2023-06-27 NOTE — H&P (Signed)
Subjective:   CC: Diverticulitis of large intestine with perforation without bleeding [K57.20]  HPI:  Walter Tyler is a 68 y.o. male who returns for evaluation of above.     Past Medical History:  has a past medical history of Exercise tolerance finding and Wears glasses.  Past Surgical History:  has a past surgical history that includes open reduction carpal scaphoid fracture (Right, 03/09/2012); Arthroscopic Rotator Cuff Repair (Right); Removal Hardware Wrist Hand/Finger (Right); removal hardware wrist/hand/finger (Right, 04/16/2013); intraoperative flouroscopy (04/16/2013); and Exploratory laparotomy (12/17/2022).  Family History: family history includes Diabetes type II in his father and mother.  Social History:  reports that he has never smoked. He has never used smokeless tobacco. He reports that he does not drink alcohol and does not use drugs.  Current Medications: has a current medication list which includes the following prescription(s): acetaminophen, aspirin, and meloxicam.  Allergies:  No Known Allergies  ROS:  A 15 point review of systems was performed and pertinent positives and negatives noted in HPI   Objective:     BP (!) 147/80   Pulse (!) 47   Ht 177.8 cm (5\' 10" )   Wt 72.6 kg (160 lb)   BMI 22.96 kg/m   Constitutional :  No distress, cooperative, alert  Lymphatics/Throat:  Supple with no lymphadenopathy  Respiratory:  Clear to auscultation bilaterally  Cardiovascular:  Regular rate and rhythm  Gastrointestinal: Soft, non-tender, non-distended, no organomegaly.  Musculoskeletal: Steady gait and movement  Skin: Cool and moist, colostomy in place  Psychiatric: Normal affect, non-agitated, not confused       LABS:  N/a    RADS: N/a  Assessment:      Diverticulitis of large intestine with perforation without bleeding [K57.20]- ready for ostomy reversal and colonoscopy  Plan:     R/b/a discussed.  Risks include bleeding, perforation.   Benefits include diagnostic, curative procedure if needed.  Alternatives include continued observation.  Pt verbalized understanding.   The risk of surgery includes, but not limited to, recurrence, bleeding, chronic pain, post-op infxn, post-op SBO or ileus, hernias, resection of bowel, re-anastamosis, possible ostomy placement and need for re-operation to address said risks. The risks of general anesthetic, if used, includes MI, CVA, sudden death or even reaction to anesthetic medications also discussed. Alternatives include continued observation.  Benefits include possible symptom relief, preventing further decline in health and possible death.   Typical post-op recovery time of additional days in hospital for observation afterwards also discussed.   Prep ordered.  Will proceed with ERAS protocol as well.    The patient verbalized understanding and all questions were answered to the patient's satisfaction.  labs/images/medications/previous chart entries reviewed personally and relevant changes/updates noted above.

## 2023-06-27 NOTE — H&P (View-Only) (Signed)
 Subjective:   CC: Diverticulitis of large intestine with perforation without bleeding [K57.20]  HPI:  Walter Tyler is a 68 y.o. male who returns for evaluation of above.     Past Medical History:  has a past medical history of Exercise tolerance finding and Wears glasses.  Past Surgical History:  has a past surgical history that includes open reduction carpal scaphoid fracture (Right, 03/09/2012); Arthroscopic Rotator Cuff Repair (Right); Removal Hardware Wrist Hand/Finger (Right); removal hardware wrist/hand/finger (Right, 04/16/2013); intraoperative flouroscopy (04/16/2013); and Exploratory laparotomy (12/17/2022).  Family History: family history includes Diabetes type II in his father and mother.  Social History:  reports that he has never smoked. He has never used smokeless tobacco. He reports that he does not drink alcohol and does not use drugs.  Current Medications: has a current medication list which includes the following prescription(s): acetaminophen, aspirin, and meloxicam.  Allergies:  No Known Allergies  ROS:  A 15 point review of systems was performed and pertinent positives and negatives noted in HPI   Objective:     BP (!) 147/80   Pulse (!) 47   Ht 177.8 cm (5\' 10" )   Wt 72.6 kg (160 lb)   BMI 22.96 kg/m   Constitutional :  No distress, cooperative, alert  Lymphatics/Throat:  Supple with no lymphadenopathy  Respiratory:  Clear to auscultation bilaterally  Cardiovascular:  Regular rate and rhythm  Gastrointestinal: Soft, non-tender, non-distended, no organomegaly.  Musculoskeletal: Steady gait and movement  Skin: Cool and moist, colostomy in place  Psychiatric: Normal affect, non-agitated, not confused       LABS:  N/a    RADS: N/a  Assessment:      Diverticulitis of large intestine with perforation without bleeding [K57.20]- ready for ostomy reversal and colonoscopy  Plan:     R/b/a discussed.  Risks include bleeding, perforation.   Benefits include diagnostic, curative procedure if needed.  Alternatives include continued observation.  Pt verbalized understanding.   The risk of surgery includes, but not limited to, recurrence, bleeding, chronic pain, post-op infxn, post-op SBO or ileus, hernias, resection of bowel, re-anastamosis, possible ostomy placement and need for re-operation to address said risks. The risks of general anesthetic, if used, includes MI, CVA, sudden death or even reaction to anesthetic medications also discussed. Alternatives include continued observation.  Benefits include possible symptom relief, preventing further decline in health and possible death.   Typical post-op recovery time of additional days in hospital for observation afterwards also discussed.   Prep ordered.  Will proceed with ERAS protocol as well.    The patient verbalized understanding and all questions were answered to the patient's satisfaction.  labs/images/medications/previous chart entries reviewed personally and relevant changes/updates noted above.

## 2023-07-05 ENCOUNTER — Encounter: Payer: Self-pay | Admitting: Surgery

## 2023-07-05 ENCOUNTER — Other Ambulatory Visit: Payer: Self-pay

## 2023-07-05 ENCOUNTER — Encounter
Admission: RE | Admit: 2023-07-05 | Discharge: 2023-07-05 | Disposition: A | Discharge status: Home / Self Care | Payer: PRIVATE HEALTH INSURANCE | Source: Ambulatory Visit | Attending: Surgery | Admitting: Surgery

## 2023-07-05 DIAGNOSIS — K631 Perforation of intestine (nontraumatic): Secondary | ICD-10-CM

## 2023-07-05 DIAGNOSIS — Z8719 Personal history of other diseases of the digestive system: Secondary | ICD-10-CM

## 2023-07-05 DIAGNOSIS — Z933 Colostomy status: Secondary | ICD-10-CM

## 2023-07-05 HISTORY — DX: Colostomy status: Z93.3

## 2023-07-05 HISTORY — DX: Perforation of intestine (nontraumatic): K63.1

## 2023-07-05 HISTORY — DX: Diverticulitis of large intestine without perforation or abscess without bleeding: K57.32

## 2023-07-05 NOTE — Patient Instructions (Addendum)
Your procedure is scheduled on: 07/12/23 - Tuesday Report to the Registration Desk on the 1st floor of the [facility]. To find out your arrival time, please call [phone] between 1PM - 3PM on: 07/11/23 - Monday If your arrival time is 6:00 am, do not arrive before that time as the [facility] Mall entrance doors do not open until 6:00 am. Report to [facility] for your EKG at 8:00 am on 07/11/23.  REMEMBER: Instructions that are not followed completely may result in serious medical risk, up to and including death; or upon the discretion of your surgeon and anesthesiologist your surgery may need to be rescheduled.  Follow Bowel Prep instructions given to you by Dr. Tonna Boehringer on 07/11/23.  Follow Instructions and take medications as prescribed to you by Dr. Tonna Boehringer on 07/11/23  :  metroNIDAZOLE (FLAGYL) 500 MG tablet  Take 2 tablets at 2pm, 3pm, and 10pm the day before surgery 6 tablet      06/27/2023 07/16/2024  erythromycin base (E-MYCIN) 500 MG tablet  Take 2 tablets at 2pm, 3pm, and 10pm the day before surgery         One week prior to surgery: Stop Anti-inflammatories (NSAIDS) such as Advil, Aleve, Ibuprofen, Motrin, Naproxen, Naprosyn and Aspirin based products such as Excedrin, Goody's Powder, BC Powder.  Stop ANY OVER THE COUNTER supplements until after surgery.  You may take Tylenol if needed for pain up until the day of surgery.   ON THE DAY OF SURGERY ONLY TAKE THESE MEDICATIONS WITH SIPS OF WATER:  none   No Alcohol for 24 hours before or after surgery.  No Smoking including e-cigarettes for 24 hours before surgery.  No chewable tobacco products for at least 6 hours before surgery.  No nicotine patches on the day of surgery.  Do not use any "recreational" drugs for at least a week (preferably 2 weeks) before your surgery.  Please be advised that the combination of cocaine and anesthesia may have negative outcomes, up to and including death. If you test positive  for cocaine, your surgery will be cancelled.  On the morning of surgery brush your teeth with toothpaste and water, you may rinse your mouth with mouthwash if you wish. Do not swallow any toothpaste or mouthwash.  Use CHG Soap or wipes as directed on instruction sheet.  Do not wear jewelry, make-up, hairpins, clips or nail polish.  For welded (permanent) jewelry: bracelets, anklets, waist bands, etc.  Please have this removed prior to surgery.  If it is not removed, there is a chance that hospital personnel will need to cut it off on the day of surgery.  Do not wear lotions, powders, or perfumes.   Do not shave body hair from the neck down 48 hours before surgery.  Contact lenses, hearing aids and dentures may not be worn into surgery.  Do not bring valuables to the hospital. [facility] is not responsible for any missing/lost belongings or valuables.   Notify your doctor if there is any change in your medical condition (cold, fever, infection).  Wear comfortable clothing (specific to your surgery type) to the hospital.  After surgery, you can help prevent lung complications by doing breathing exercises.  Take deep breaths and cough every 1-2 hours. Your doctor may order a device called an Incentive Spirometer to help you take deep breaths. When coughing or sneezing, hold a pillow firmly against your incision with both hands. This is called "splinting." Doing this helps protect your incision. It  also decreases belly discomfort.  If you are being admitted to the hospital overnight, leave your suitcase in the car. After surgery it may be brought to your room.  In case of increased patient census, it may be necessary for you, the patient, to continue your postoperative care in the Same Day Surgery department.  If you are being discharged the day of surgery, you will not be allowed to drive home. You will need a responsible individual to drive you home and stay with you for 24 hours after  surgery.   If you are taking public transportation, you will need to have a responsible individual with you.  Please call the Pre-admissions Testing Dept. at [phone] if you have any questions about these instructions.  Surgery Visitation Policy:  Patients having surgery or a procedure may have two visitors.  Children under the age of 43 must have an adult with them who is not the patient.  Temporary Visitor Restrictions Due to increasing cases of flu, RSV and COVID-19: Children ages 19 and under will not be able to visit patients in [facility] hospitals under most circumstances.  Inpatient Visitation:    Visiting hours are 7 a.m. to 8 p.m. Up to four visitors are allowed at one time in a patient room. The visitors may rotate out with other people during the day.  One visitor age 26 or older may stay with the patient overnight and must be in the room by 8 p.m.     Preparing for Surgery with CHLORHEXIDINE GLUCONATE (CHG) Soap  Chlorhexidine Gluconate (CHG) Soap  o An antiseptic cleaner that kills germs and bonds with the skin to continue killing germs even after washing  o Used for showering the night before surgery and morning of surgery  Before surgery, you can play an important role by reducing the number of germs on your skin.  CHG (Chlorhexidine gluconate) soap is an antiseptic cleanser which kills germs and bonds with the skin to continue killing germs even after washing.  Please do not use if you have an allergy to CHG or antibacterial soaps. If your skin becomes reddened/irritated stop using the CHG.  1. Shower the NIGHT BEFORE SURGERY and the MORNING OF SURGERY with CHG soap.  2. If you choose to wash your hair, wash your hair first as usual with your normal shampoo.  3. After shampooing, rinse your hair and body thoroughly to remove the shampoo.  4. Use CHG as you would any other liquid soap. You can apply CHG directly to the skin and wash gently with a  scrungie or a clean washcloth.  5. Apply the CHG soap to your body only from the neck down. Do not use on open wounds or open sores. Avoid contact with your eyes, ears, mouth, and genitals (private parts). Wash face and genitals (private parts) with your normal soap.  6. Wash thoroughly, paying special attention to the area where your surgery will be performed.  7. Thoroughly rinse your body with warm water.  8. Do not shower/wash with your normal soap after using and rinsing off the CHG soap.  9. Pat yourself dry with a clean towel.  10. Wear clean pajamas to bed the night before surgery.  12. Place clean sheets on your bed the night of your first shower and do not sleep with pets.  13. Shower again with the CHG soap on the day of surgery prior to arriving at the hospital.  14. Do not apply any deodorants/lotions/powders.  15. Please wear clean clothes to the hospital.

## 2023-07-06 ENCOUNTER — Encounter
Admission: RE | Disposition: A | Discharge status: Home / Self Care | Payer: Self-pay | Source: Ambulatory Visit | Attending: Surgery

## 2023-07-06 ENCOUNTER — Ambulatory Visit
Admission: RE | Admit: 2023-07-06 | Discharge: 2023-07-06 | Disposition: A | Discharge status: Home / Self Care | Payer: PRIVATE HEALTH INSURANCE | Source: Ambulatory Visit | Attending: Surgery | Admitting: Surgery

## 2023-07-06 ENCOUNTER — Ambulatory Visit: Payer: PRIVATE HEALTH INSURANCE | Admitting: Certified Registered"

## 2023-07-06 DIAGNOSIS — K5720 Diverticulitis of large intestine with perforation and abscess without bleeding: Secondary | ICD-10-CM | POA: Diagnosis present

## 2023-07-06 DIAGNOSIS — Z933 Colostomy status: Secondary | ICD-10-CM | POA: Insufficient documentation

## 2023-07-06 DIAGNOSIS — K5730 Diverticulosis of large intestine without perforation or abscess without bleeding: Secondary | ICD-10-CM | POA: Insufficient documentation

## 2023-07-06 DIAGNOSIS — Z09 Encounter for follow-up examination after completed treatment for conditions other than malignant neoplasm: Secondary | ICD-10-CM | POA: Insufficient documentation

## 2023-07-06 HISTORY — PX: COLONOSCOPY WITH PROPOFOL: SHX5780

## 2023-07-06 SURGERY — COLONOSCOPY WITH PROPOFOL
Anesthesia: General | Site: Rectum

## 2023-07-06 MED ORDER — SODIUM CHLORIDE 0.9 % IV SOLN: INTRAVENOUS | Status: DC

## 2023-07-06 MED ORDER — PROPOFOL 500 MG/50ML IV EMUL
INTRAVENOUS | Status: DC | PRN
  Administered 2023-07-06: 120 ug/kg/min via INTRAVENOUS

## 2023-07-06 MED ORDER — PROPOFOL 10 MG/ML IV BOLUS
INTRAVENOUS | Status: DC | PRN
  Administered 2023-07-06: 30 mg via INTRAVENOUS
  Administered 2023-07-06: 70 mg via INTRAVENOUS

## 2023-07-06 MED ORDER — LIDOCAINE 2% (20 MG/ML) 5 ML SYRINGE
INTRAMUSCULAR | Status: DC | PRN
  Administered 2023-07-06: 20 mg via INTRAVENOUS

## 2023-07-06 MED ORDER — GLYCOPYRROLATE 0.2 MG/ML IJ SOLN
INTRAMUSCULAR | Status: DC | PRN
  Administered 2023-07-06: .2 mg via INTRAVENOUS

## 2023-07-06 NOTE — H&P (Signed)
Subjective:  CC: Diverticulitis of large intestine with perforation without bleeding [K57.20]  HPI: Walter Tyler is a 68 y.o. male who returns for evaluation of above. No issues. Ready for reversal   Past Medical History: has a past medical history of Exercise tolerance finding and Wears glasses.  Past Surgical History: has a past surgical history that includes open reduction carpal scaphoid fracture (Right, 03/09/2012); Arthroscopic Rotator Cuff Repair (Right); Removal Hardware Wrist Hand/Finger (Right); removal hardware wrist/hand/finger (Right, 04/16/2013); intraoperative flouroscopy (04/16/2013); and Exploratory laparotomy (12/17/2022).  Family History: family history includes Diabetes type II in his father and mother.  Social History: reports that he has never smoked. He has never used smokeless tobacco. He reports that he does not drink alcohol and does not use drugs.  Current Medications: has a current medication list which includes the following prescription(s): acetaminophen, aspirin, and meloxicam.  Allergies: No Known Allergies  ROS: A 15 point review of systems was performed and pertinent positives and negatives noted in HPI  Objective:   BP (!) 147/80  Pulse (!) 47  Ht 177.8 cm (5\' 10" )  Wt 72.6 kg (160 lb)  BMI 22.96 kg/m  Constitutional : No distress, cooperative, alert Lymphatics/Throat: Supple with no lymphadenopathy Respiratory: Clear to auscultation bilaterally Cardiovascular: Regular rate and rhythm Gastrointestinal: Soft, non-tender, non-distended, no organomegaly. Musculoskeletal: Steady gait and movement Skin: Cool and moist, colostomy in place Psychiatric: Normal affect, non-agitated, not confused   LABS: N/a  RADS: N/a  Assessment:   Diverticulitis of large intestine with perforation without bleeding [K57.20]- ready for ostomy reversal and colonoscopy  Plan:   R/b/a discussed. Risks include bleeding, perforation. Benefits include  diagnostic, curative procedure if needed. Alternatives include continued observation. Pt verbalized understanding.  The risk of surgery includes, but not limited to, recurrence, bleeding, chronic pain, post-op infxn, post-op SBO or ileus, hernias, resection of bowel, re-anastamosis, possible ostomy placement and need for re-operation to address said risks. The risks of general anesthetic, if used, includes MI, CVA, sudden death or even reaction to anesthetic medications also discussed. Alternatives include continued observation. Benefits include possible symptom relief, preventing further decline in health and possible death.  Typical post-op recovery time of additional days in hospital for observation afterwards also discussed.  Prep ordered. Will proceed with ERAS protocol as well.  The patient verbalized understanding and all questions were answered to the patient's satisfaction.  labs/images/medications/previous chart entries reviewed personally and relevant changes/updates noted above.   Electronically signed by Sung Amabile, DO at 06/27/2023 1:20 PM EST

## 2023-07-06 NOTE — Op Note (Addendum)
[facility] Patient Name: [name] Procedure Date: 07/06/2023 8:01 AM MRN: [id number] Account #: [id number] Date of Birth: 1955/12/29 Admit Type: Outpatient Age: 68 Room: [facility] ENDO ROOM 1 Gender: Male Note Status: Addendum Instrument Name: Colonoscope [id number] Procedure:             Colonoscopy Indications:           Follow-up of diverticulitis Providers:             Sung Amabile MD, MD Referring MD:          Onnie Boer. Sowles, MD (Referring MD) Medicines:             Propofol per Anesthesia Complications:         No immediate complications. Procedure:             Pre-Anesthesia Assessment:                        - After reviewing the risks and benefits, the patient                         was deemed in satisfactory condition to undergo the                         procedure in an ambulatory setting.                        After obtaining informed consent, the colonoscope was                         passed under direct vision. Throughout the procedure,                         the patient's blood pressure, pulse, and oxygen                         saturations were monitored continuously. The                         Colonoscope was introduced through the anus and                         advanced to the the cecum, identified by the ileocecal                         valve. The colonoscopy was performed without                         difficulty. The patient tolerated the procedure well.                         The quality of the bowel preparation was good. Findings:      The perianal and digital rectal examinations were normal.      A few small-mouthed diverticula were found in the distal descending       colon.      There was evidence of an end colostomy in the descending colon. This was       characterized by healthy appearing mucosa. Impression:            - Diverticulosis in the distal  descending colon.                        - Widely patent  end colostomy with healthy appearing                         mucosa in the sigmoid colon.                        - No specimens collected. Recommendation:        - Discharge patient to home.                        - Resume previous diet.                        - Written discharge instructions were provided to the                         patient.                        - Repeat colonoscopy in 10 years for screening                         purposes. Procedure Code(s):     --- Professional ---                        [phone], Colonoscopy, flexible; diagnostic, including                         collection of specimen(s) by brushing or washing, when                         performed (separate procedure) Diagnosis Code(s):     --- Professional ---                        Z93.3, Colostomy status                        K57.32, Diverticulitis of large intestine without                         perforation or abscess without bleeding                        K57.30, Diverticulosis of large intestine without                         perforation or abscess without bleeding CPT copyright 2022 American [employer] Association. All rights reserved. The codes documented in this report are preliminary and upon coder review may  be revised to meet current compliance requirements. Dr. Harrie Foreman, MD Sung Amabile MD, MD 07/06/2023 8:29:41 AM This report has been signed electronically. Number of Addenda: 1 Note Initiated On: 07/06/2023 8:01 AM Scope Withdrawal Time: 0 hours 3 minutes 2 seconds  Total Procedure Duration: 0 hours 10 minutes 35 seconds  Estimated Blood Loss:  Estimated blood loss: none.      [facility] Addendum Number: 1   Addendum Date: 07/06/2023 10:37:48 AM      Error noted  in description of procedure. scope initally passed through       anus to rectal stump, then scope removed and passed through end       colostomy to cecum. Dr. Harrie Foreman, MD Sung Amabile MD, MD 07/06/2023  10:39:56 AM This report has been signed electronically.

## 2023-07-06 NOTE — Anesthesia Preprocedure Evaluation (Signed)
Anesthesia Evaluation  Patient identified by MRN, date of birth, ID band Patient awake    Reviewed: Allergy & Precautions, NPO status , Patient's Chart, lab work & pertinent test results  History of Anesthesia Complications Negative for: history of anesthetic complications  Airway Mallampati: II  TM Distance: >3 FB Neck ROM: Full    Dental no notable dental hx. (+) Teeth Intact   Pulmonary neg pulmonary ROS, neg sleep apnea, neg COPD, Patient abstained from smoking.Not current smoker   Pulmonary exam normal breath sounds clear to auscultation       Cardiovascular Exercise Tolerance: Good METS(-) hypertension(-) CAD and (-) Past MI negative cardio ROS (-) dysrhythmias  Rhythm:Regular Rate:Normal - Systolic murmurs    Neuro/Psych negative neurological ROS  negative psych ROS   GI/Hepatic ,neg GERD  ,,(+)     (-) substance abuse    Endo/Other  neg diabetes    Renal/GU negative Renal ROS     Musculoskeletal   Abdominal   Peds  Hematology   Anesthesia Other Findings Past Medical History: No date: BPH (benign prostatic hyperplasia) No date: Colostomy in place [facility]) No date: Diverticulitis large intestine No date: Perforation of sigmoid colon (HCC) No date: Shoulder pain, acute  Reproductive/Obstetrics                             Anesthesia Physical Anesthesia Plan  ASA: 1  Anesthesia Plan: General   Post-op Pain Management: Minimal or no pain anticipated   Induction: Intravenous  PONV Risk Score and Plan: 2 and Propofol infusion, TIVA and Ondansetron  Airway Management Planned: Nasal Cannula  Additional Equipment: None  Intra-op Plan:   Post-operative Plan:   Informed Consent: I have reviewed the patients History and Physical, chart, labs and discussed the procedure including the risks, benefits and alternatives for the proposed anesthesia with the patient or authorized  representative who has indicated his/her understanding and acceptance.     Dental advisory given  Plan Discussed with: CRNA and Surgeon  Anesthesia Plan Comments: (Discussed risks of anesthesia with patient, including possibility of difficulty with spontaneous ventilation under anesthesia necessitating airway intervention, PONV, and rare risks such as cardiac or respiratory or neurological events, and allergic reactions. Discussed the role of CRNA in patient's perioperative care. Patient understands.)       Anesthesia Quick Evaluation

## 2023-07-06 NOTE — Interval H&P Note (Signed)
History and Physical Interval Note:  07/06/2023 7:04 AM  Walter Tyler  has presented today for surgery, with the diagnosis of K57.20 diverticulitis.  The various methods of treatment have been discussed with the patient and family. After consideration of risks, benefits and other options for treatment, the patient has consented to  Procedure(s) with comments: COLONOSCOPY WITH PROPOFOL (N/A) - PATIENT HAS A BAG PER OFFICE as a surgical intervention.  The patient's history has been reviewed, patient examined, no change in status, stable for surgery.  I have reviewed the patient's chart and labs.  Questions were answered to the patient's satisfaction.     Kaleab Frasier Tonna Boehringer

## 2023-07-06 NOTE — Anesthesia Postprocedure Evaluation (Signed)
Anesthesia Post Note  Patient: Walter Tyler  Procedure(s) Performed: COLONOSCOPY WITH PROPOFOL (Rectum)  Patient location during evaluation: Endoscopy Anesthesia Type: General Level of consciousness: awake and alert Pain management: pain level controlled Vital Signs Assessment: post-procedure vital signs reviewed and stable Respiratory status: spontaneous breathing, nonlabored ventilation, respiratory function stable and patient connected to nasal cannula oxygen Cardiovascular status: blood pressure returned to baseline and stable Postop Assessment: no apparent nausea or vomiting Anesthetic complications: no   No notable events documented.   Last Vitals:  Vitals:   07/06/23 0836 07/06/23 0846  BP: 107/70 (!) 124/91  Pulse: 68 70  Resp: 13 20  Temp:    SpO2: 99% 100%    Last Pain:  Vitals:   07/06/23 0846  TempSrc:   PainSc: 0-No pain                 [name]

## 2023-07-06 NOTE — Transfer of Care (Addendum)
Immediate Anesthesia Transfer of Care Note  Patient: Sufyan Meidinger  Procedure(s) Performed: COLONOSCOPY WITH PROPOFOL (Rectum)  Patient Location: Endoscopy Unit  Anesthesia Type:General  Level of Consciousness: drowsy  Airway & Oxygen Therapy: Patient Spontanous Breathing  Post-op Assessment: Report given to RN and Post -op Vital signs reviewed and stable  Post vital signs: Reviewed  Last Vitals:  Vitals Value Taken Time  BP 95/62 07/06/23 0826  Temp 36.1 C 07/06/23 0826  Pulse 74 07/06/23 0827  Resp 12 07/06/23 0827  SpO2 99 % 07/06/23 0827  Vitals shown include unfiled device data.  Last Pain:  Vitals:   07/06/23 0826  TempSrc: Tympanic  PainSc: Asleep         Complications: No notable events documented.

## 2023-07-07 ENCOUNTER — Encounter: Payer: Self-pay | Admitting: Surgery

## 2023-07-11 ENCOUNTER — Encounter
Admission: RE | Admit: 2023-07-11 | Discharge: 2023-07-11 | Disposition: A | Discharge status: Home / Self Care | Payer: PRIVATE HEALTH INSURANCE | Source: Ambulatory Visit | Attending: Surgery | Admitting: Surgery

## 2023-07-11 DIAGNOSIS — Z933 Colostomy status: Secondary | ICD-10-CM | POA: Insufficient documentation

## 2023-07-11 DIAGNOSIS — Z8719 Personal history of other diseases of the digestive system: Secondary | ICD-10-CM

## 2023-07-11 DIAGNOSIS — K631 Perforation of intestine (nontraumatic): Secondary | ICD-10-CM

## 2023-07-11 DIAGNOSIS — Z01810 Encounter for preprocedural cardiovascular examination: Secondary | ICD-10-CM | POA: Insufficient documentation

## 2023-07-12 ENCOUNTER — Inpatient Hospital Stay: Payer: PRIVATE HEALTH INSURANCE | Admitting: Anesthesiology

## 2023-07-12 ENCOUNTER — Encounter
Admission: RE | Disposition: A | Discharge status: Home / Self Care | Payer: Self-pay | Source: Ambulatory Visit | Attending: Surgery

## 2023-07-12 ENCOUNTER — Telehealth: Payer: Self-pay | Admitting: Family Medicine

## 2023-07-12 ENCOUNTER — Other Ambulatory Visit: Payer: Self-pay

## 2023-07-12 ENCOUNTER — Encounter: Payer: Self-pay | Admitting: Surgery

## 2023-07-12 ENCOUNTER — Inpatient Hospital Stay
Admission: RE | Admit: 2023-07-12 | Discharge: 2023-07-15 | DRG: 336 | Disposition: A | Discharge status: Home / Self Care | Payer: PRIVATE HEALTH INSURANCE | Attending: Surgery | Admitting: Surgery

## 2023-07-12 DIAGNOSIS — Z8719 Personal history of other diseases of the digestive system: Secondary | ICD-10-CM | POA: Diagnosis not present

## 2023-07-12 DIAGNOSIS — K6389 Other specified diseases of intestine: Secondary | ICD-10-CM | POA: Diagnosis not present

## 2023-07-12 DIAGNOSIS — N400 Enlarged prostate without lower urinary tract symptoms: Secondary | ICD-10-CM | POA: Diagnosis present

## 2023-07-12 DIAGNOSIS — Y838 Other surgical procedures as the cause of abnormal reaction of the patient, or of later complication, without mention of misadventure at the time of the procedure: Secondary | ICD-10-CM | POA: Diagnosis not present

## 2023-07-12 DIAGNOSIS — Z433 Encounter for attention to colostomy: Principal | ICD-10-CM

## 2023-07-12 DIAGNOSIS — K9189 Other postprocedural complications and disorders of digestive system: Secondary | ICD-10-CM | POA: Diagnosis not present

## 2023-07-12 DIAGNOSIS — K660 Peritoneal adhesions (postprocedural) (postinfection): Secondary | ICD-10-CM | POA: Diagnosis present

## 2023-07-12 DIAGNOSIS — Z833 Family history of diabetes mellitus: Secondary | ICD-10-CM | POA: Diagnosis not present

## 2023-07-12 DIAGNOSIS — Z939 Artificial opening status, unspecified: Principal | ICD-10-CM

## 2023-07-12 HISTORY — PX: XI ROBOTIC ASSISTED COLOSTOMY TAKEDOWN: SHX6828

## 2023-07-12 LAB — CBC
HCT: 40.2 % (ref 39.0–52.0)
Hemoglobin: 13.8 g/dL (ref 13.0–17.0)
MCH: 30 pg (ref 26.0–34.0)
MCHC: 34.3 g/dL (ref 30.0–36.0)
MCV: 87.4 fL (ref 80.0–100.0)
Platelets: 164 10*3/uL (ref 150–400)
RBC: 4.6 MIL/uL (ref 4.22–5.81)
RDW: 13 % (ref 11.5–15.5)
WBC: 7.3 10*3/uL (ref 4.0–10.5)
nRBC: 0 % (ref 0.0–0.2)

## 2023-07-12 LAB — CREATININE, SERUM
Creatinine, Ser: 1.2 mg/dL (ref 0.61–1.24)
GFR, Estimated: >60 mL/min (ref >60)

## 2023-07-12 SURGERY — CLOSURE, COLOSTOMY, ROBOT-ASSISTED
Anesthesia: General

## 2023-07-12 MED ORDER — EPHEDRINE 5 MG/ML INJ
INTRAVENOUS | Status: AC
  Filled 2023-07-12: qty 5

## 2023-07-12 MED ORDER — KETAMINE HCL 50 MG/5ML IJ SOSY
PREFILLED_SYRINGE | INTRAMUSCULAR | Status: DC | PRN
  Administered 2023-07-12: 30 mg via INTRAVENOUS
  Administered 2023-07-12 (×2): 10 mg via INTRAVENOUS

## 2023-07-12 MED ORDER — FENTANYL CITRATE (PF) 100 MCG/2ML IJ SOLN
INTRAMUSCULAR | Status: AC
  Filled 2023-07-12: qty 2

## 2023-07-12 MED ORDER — SODIUM CHLORIDE 0.9 % IV SOLN
2.0000 g | Freq: Once | INTRAVENOUS | Status: DC
  Filled 2023-07-12: qty 2

## 2023-07-12 MED ORDER — ROCURONIUM BROMIDE 100 MG/10ML IV SOLN
INTRAVENOUS | Status: DC | PRN
  Administered 2023-07-12: 10 mg via INTRAVENOUS
  Administered 2023-07-12: 20 mg via INTRAVENOUS
  Administered 2023-07-12: 50 mg via INTRAVENOUS
  Administered 2023-07-12: 10 mg via INTRAVENOUS

## 2023-07-12 MED ORDER — PROPOFOL 1000 MG/100ML IV EMUL
INTRAVENOUS | Status: AC
  Filled 2023-07-12: qty 100

## 2023-07-12 MED ORDER — SODIUM CHLORIDE 0.9 % IV SOLN
2.0000 g | INTRAVENOUS | Status: DC
  Administered 2023-07-12 – 2023-07-14 (×3): 2 g via INTRAVENOUS
  Filled 2023-07-12 (×5): qty 20

## 2023-07-12 MED ORDER — BUPIVACAINE-EPINEPHRINE (PF) 0.5% -1:200000 IJ SOLN
INTRAMUSCULAR | Status: AC
  Filled 2023-07-12: qty 30

## 2023-07-12 MED ORDER — KETAMINE HCL 50 MG/5ML IJ SOSY
PREFILLED_SYRINGE | INTRAMUSCULAR | Status: AC
  Filled 2023-07-12: qty 5

## 2023-07-12 MED ORDER — GLYCOPYRROLATE 0.2 MG/ML IJ SOLN
INTRAMUSCULAR | Status: AC
  Filled 2023-07-12: qty 1

## 2023-07-12 MED ORDER — EPHEDRINE SULFATE-NACL 50-0.9 MG/10ML-% IV SOSY
PREFILLED_SYRINGE | INTRAVENOUS | Status: DC | PRN
  Administered 2023-07-12: 10 mg via INTRAVENOUS
  Administered 2023-07-12 (×3): 5 mg via INTRAVENOUS

## 2023-07-12 MED ORDER — ESMOLOL HCL 100 MG/10ML IV SOLN
INTRAVENOUS | Status: AC
Start: 2023-07-12 — End: ?
  Filled 2023-07-12: qty 10

## 2023-07-12 MED ORDER — SODIUM CHLORIDE (PF) 0.9 % IJ SOLN
INTRAMUSCULAR | Status: AC
  Filled 2023-07-12: qty 20

## 2023-07-12 MED ORDER — PHENYLEPHRINE 80 MCG/ML (10ML) SYRINGE FOR IV PUSH (FOR BLOOD PRESSURE SUPPORT)
PREFILLED_SYRINGE | INTRAVENOUS | Status: DC | PRN
  Administered 2023-07-12 (×6): 80 ug via INTRAVENOUS

## 2023-07-12 MED ORDER — SODIUM CHLORIDE 0.9 % IV SOLN: INTRAVENOUS | Status: DC

## 2023-07-12 MED ORDER — SODIUM CHLORIDE 0.9 % IV SOLN
2.0000 g | INTRAVENOUS | Status: AC
  Administered 2023-07-12 (×2): 2 g via INTRAVENOUS

## 2023-07-12 MED ORDER — GLYCOPYRROLATE 0.2 MG/ML IJ SOLN
INTRAMUSCULAR | Status: DC | PRN
  Administered 2023-07-12 (×2): .2 mg via INTRAVENOUS

## 2023-07-12 MED ORDER — GABAPENTIN 100 MG PO CAPS
300.0000 mg | ORAL_CAPSULE | Freq: Two times a day (BID) | ORAL | Status: DC
  Administered 2023-07-12 – 2023-07-15 (×7): 300 mg via ORAL
  Filled 2023-07-12 (×5): qty 3

## 2023-07-12 MED ORDER — ACETAMINOPHEN 500 MG PO TABS
ORAL_TABLET | ORAL | Status: AC
  Filled 2023-07-12: qty 2

## 2023-07-12 MED ORDER — ONDANSETRON HCL 4 MG/2ML IJ SOLN: 4.0000 mg | Freq: Four times a day (QID) | INTRAMUSCULAR | Status: DC | PRN

## 2023-07-12 MED ORDER — CELECOXIB 200 MG PO CAPS
200.0000 mg | ORAL_CAPSULE | Freq: Two times a day (BID) | ORAL | Status: DC
  Administered 2023-07-12 – 2023-07-15 (×7): 200 mg via ORAL
  Filled 2023-07-12 (×6): qty 1

## 2023-07-12 MED ORDER — ESMOLOL HCL 100 MG/10ML IV SOLN
INTRAVENOUS | Status: DC | PRN
Start: 2023-07-12 — End: 2023-07-12
  Administered 2023-07-12: 10 mg via INTRAVENOUS

## 2023-07-12 MED ORDER — DEXAMETHASONE SODIUM PHOSPHATE 10 MG/ML IJ SOLN
INTRAMUSCULAR | Status: AC
  Filled 2023-07-12: qty 1

## 2023-07-12 MED ORDER — SODIUM CHLORIDE 0.9 % IV SOLN
INTRAVENOUS | Status: AC
  Filled 2023-07-12: qty 2

## 2023-07-12 MED ORDER — DEXMEDETOMIDINE HCL IN NACL 80 MCG/20ML IV SOLN
INTRAVENOUS | Status: DC | PRN
Start: 2023-07-12 — End: 2023-07-12
  Administered 2023-07-12: 4 ug via INTRAVENOUS

## 2023-07-12 MED ORDER — CHLORHEXIDINE GLUCONATE 0.12 % MT SOLN
OROMUCOSAL | Status: AC
Start: 2023-07-12 — End: ?
  Filled 2023-07-12: qty 15

## 2023-07-12 MED ORDER — DEXAMETHASONE SODIUM PHOSPHATE 10 MG/ML IJ SOLN
INTRAMUSCULAR | Status: DC | PRN
  Administered 2023-07-12: 8 mg via INTRAVENOUS

## 2023-07-12 MED ORDER — SODIUM CHLORIDE 0.9 % IR SOLN
Status: DC | PRN
  Administered 2023-07-12: 700 mL

## 2023-07-12 MED ORDER — BUPIVACAINE LIPOSOME 1.3 % IJ SUSP
INTRAMUSCULAR | Status: AC
  Filled 2023-07-12: qty 20

## 2023-07-12 MED ORDER — OXYCODONE HCL 5 MG PO TABS: 5.0000 mg | ORAL_TABLET | ORAL | Status: DC | PRN

## 2023-07-12 MED ORDER — PHENYLEPHRINE 80 MCG/ML (10ML) SYRINGE FOR IV PUSH (FOR BLOOD PRESSURE SUPPORT)
PREFILLED_SYRINGE | INTRAVENOUS | Status: AC
Start: 2023-07-12 — End: ?
  Filled 2023-07-12: qty 10

## 2023-07-12 MED ORDER — ACETAMINOPHEN 10 MG/ML IV SOLN: 1000.0000 mg | Freq: Once | INTRAVENOUS | Status: DC | PRN

## 2023-07-12 MED ORDER — CHLORHEXIDINE GLUCONATE 0.12 % MT SOLN
15.0000 mL | Freq: Once | OROMUCOSAL | Status: AC
  Administered 2023-07-12: 15 mL via OROMUCOSAL

## 2023-07-12 MED ORDER — ONDANSETRON 4 MG PO TBDP
4.0000 mg | ORAL_TABLET | Freq: Four times a day (QID) | ORAL | Status: DC | PRN
Start: 2023-07-12 — End: 2023-07-15

## 2023-07-12 MED ORDER — MORPHINE SULFATE (PF) 2 MG/ML IV SOLN: 1.0000 mg | INTRAVENOUS | Status: DC | PRN

## 2023-07-12 MED ORDER — SUGAMMADEX SODIUM 200 MG/2ML IV SOLN
INTRAVENOUS | Status: DC | PRN
Start: 2023-07-12 — End: 2023-07-12
  Administered 2023-07-12: 200 mg via INTRAVENOUS

## 2023-07-12 MED ORDER — ORAL CARE MOUTH RINSE: 15.0000 mL | Freq: Once | OROMUCOSAL | Status: AC

## 2023-07-12 MED ORDER — LIDOCAINE HCL (CARDIAC) PF 100 MG/5ML IV SOSY
PREFILLED_SYRINGE | INTRAVENOUS | Status: DC | PRN
  Administered 2023-07-12: 100 mg via INTRAVENOUS

## 2023-07-12 MED ORDER — FENTANYL CITRATE (PF) 100 MCG/2ML IJ SOLN
INTRAMUSCULAR | Status: DC | PRN
  Administered 2023-07-12 (×2): 50 ug via INTRAVENOUS

## 2023-07-12 MED ORDER — ENOXAPARIN SODIUM 40 MG/0.4ML IJ SOSY
40.0000 mg | PREFILLED_SYRINGE | INTRAMUSCULAR | Status: DC
  Administered 2023-07-13 – 2023-07-15 (×3): 40 mg via SUBCUTANEOUS
  Filled 2023-07-12 (×3): qty 0.4

## 2023-07-12 MED ORDER — GABAPENTIN 300 MG PO CAPS
ORAL_CAPSULE | ORAL | Status: AC
  Filled 2023-07-12: qty 1

## 2023-07-12 MED ORDER — LACTATED RINGERS IV SOLN: INTRAVENOUS | Status: DC

## 2023-07-12 MED ORDER — MIDAZOLAM HCL 2 MG/2ML IJ SOLN
INTRAMUSCULAR | Status: DC | PRN
  Administered 2023-07-12: 2 mg via INTRAVENOUS

## 2023-07-12 MED ORDER — ONDANSETRON HCL 4 MG/2ML IJ SOLN
INTRAMUSCULAR | Status: AC
  Filled 2023-07-12: qty 2

## 2023-07-12 MED ORDER — 0.9 % SODIUM CHLORIDE (POUR BTL) OPTIME
TOPICAL | Status: DC | PRN
  Administered 2023-07-12: 1000 mL

## 2023-07-12 MED ORDER — SODIUM CHLORIDE (PF) 0.9 % IJ SOLN
INTRAMUSCULAR | Status: AC
  Filled 2023-07-12: qty 10

## 2023-07-12 MED ORDER — METRONIDAZOLE 500 MG/100ML IV SOLN
500.0000 mg | Freq: Two times a day (BID) | INTRAVENOUS | Status: DC
  Administered 2023-07-12 – 2023-07-15 (×6): 500 mg via INTRAVENOUS
  Filled 2023-07-12 (×8): qty 100

## 2023-07-12 MED ORDER — TRAMADOL HCL 50 MG PO TABS: 50.0000 mg | ORAL_TABLET | Freq: Four times a day (QID) | ORAL | Status: DC | PRN

## 2023-07-12 MED ORDER — VISTASEAL 10 ML SINGLE DOSE KIT
PACK | CUTANEOUS | Status: AC
  Filled 2023-07-12: qty 10

## 2023-07-12 MED ORDER — ACETAMINOPHEN 325 MG PO TABS
650.0000 mg | ORAL_TABLET | Freq: Four times a day (QID) | ORAL | Status: DC | PRN
  Administered 2023-07-14 (×2): 650 mg via ORAL
  Filled 2023-07-12 (×2): qty 2

## 2023-07-12 MED ORDER — CELECOXIB 200 MG PO CAPS
200.0000 mg | ORAL_CAPSULE | ORAL | Status: AC
  Administered 2023-07-12: 200 mg via ORAL

## 2023-07-12 MED ORDER — SODIUM CHLORIDE (PF) 0.9 % IJ SOLN
INTRAMUSCULAR | Status: DC | PRN
  Administered 2023-07-12: 80 mL

## 2023-07-12 MED ORDER — PROPOFOL 10 MG/ML IV BOLUS
INTRAVENOUS | Status: DC | PRN
  Administered 2023-07-12: 150 mg via INTRAVENOUS

## 2023-07-12 MED ORDER — ACETAMINOPHEN 500 MG PO TABS: 1000.0000 mg | ORAL_TABLET | ORAL | Status: DC

## 2023-07-12 MED ORDER — ROCURONIUM BROMIDE 10 MG/ML (PF) SYRINGE
PREFILLED_SYRINGE | INTRAVENOUS | Status: AC
  Filled 2023-07-12: qty 10

## 2023-07-12 MED ORDER — PANTOPRAZOLE SODIUM 40 MG IV SOLR
40.0000 mg | Freq: Every day | INTRAVENOUS | Status: DC
  Administered 2023-07-12 – 2023-07-14 (×3): 40 mg via INTRAVENOUS
  Filled 2023-07-12 (×2): qty 10

## 2023-07-12 MED ORDER — MIDAZOLAM HCL 2 MG/2ML IJ SOLN
INTRAMUSCULAR | Status: AC
  Filled 2023-07-12: qty 2

## 2023-07-12 MED ORDER — GABAPENTIN 300 MG PO CAPS
300.0000 mg | ORAL_CAPSULE | ORAL | Status: AC
  Administered 2023-07-12: 300 mg via ORAL

## 2023-07-12 MED ORDER — PROPOFOL 10 MG/ML IV BOLUS
INTRAVENOUS | Status: AC
Start: 2023-07-12 — End: ?
  Filled 2023-07-12: qty 20

## 2023-07-12 MED ORDER — CHLORHEXIDINE GLUCONATE CLOTH 2 % EX PADS
6.0000 | MEDICATED_PAD | Freq: Once | CUTANEOUS | Status: AC
  Administered 2023-07-12: 6 via TOPICAL

## 2023-07-12 MED ORDER — ONDANSETRON HCL 4 MG/2ML IJ SOLN
INTRAMUSCULAR | Status: DC | PRN
  Administered 2023-07-12: 4 mg via INTRAVENOUS

## 2023-07-12 MED ORDER — LIDOCAINE HCL (PF) 2 % IJ SOLN
INTRAMUSCULAR | Status: AC
  Filled 2023-07-12: qty 5

## 2023-07-12 MED ORDER — INDOCYANINE GREEN 25 MG IV SOLR
INTRAVENOUS | Status: DC | PRN
  Administered 2023-07-12: 5 mg via INTRAVENOUS

## 2023-07-12 MED ORDER — CELECOXIB 200 MG PO CAPS
ORAL_CAPSULE | ORAL | Status: AC
  Filled 2023-07-12: qty 1

## 2023-07-12 MED ORDER — HYDROMORPHONE HCL 1 MG/ML IJ SOLN: 0.2500 mg | INTRAMUSCULAR | Status: DC | PRN

## 2023-07-12 MED ORDER — DROPERIDOL 2.5 MG/ML IJ SOLN: 0.6250 mg | Freq: Once | INTRAMUSCULAR | Status: DC | PRN

## 2023-07-12 SURGICAL SUPPLY — 85 items
APPLICATOR VISTASEAL FLEXIBLE (MISCELLANEOUS) IMPLANT
BASIN KIT SINGLE STR (MISCELLANEOUS) ×1 IMPLANT
BLADE CLIPPER SURG (BLADE) ×1 IMPLANT
COVER TIP SHEARS 8 DVNC (MISCELLANEOUS) ×1 IMPLANT
DERMABOND ADVANCED .7 DNX12 (GAUZE/BANDAGES/DRESSINGS) IMPLANT
DRAPE ARM DVNC X/XI (DISPOSABLE) ×4 IMPLANT
DRAPE COLUMN DVNC XI (DISPOSABLE) ×1 IMPLANT
DRAPE LEGGINS SURG 28X43 STRL (DRAPES) ×1 IMPLANT
DRAPE UNDER BUTTOCK W/FLU (DRAPES) ×1 IMPLANT
DRIVER NDL LRG 8 DVNC XI (INSTRUMENTS) IMPLANT
DRIVER NDLE LRG 8 DVNC XI (INSTRUMENTS) ×1 IMPLANT
DRSG OPSITE POSTOP 3X4 (GAUZE/BANDAGES/DRESSINGS) IMPLANT
DRSG OPSITE POSTOP 4X10 (GAUZE/BANDAGES/DRESSINGS) IMPLANT
DRSG OPSITE POSTOP 4X6 (GAUZE/BANDAGES/DRESSINGS) IMPLANT
DRSG OPSITE POSTOP 4X8 (GAUZE/BANDAGES/DRESSINGS) IMPLANT
DRSG TELFA 3X8 NADH STRL (GAUZE/BANDAGES/DRESSINGS) IMPLANT
ELECT REM PT RETURN 9FT ADLT (ELECTROSURGICAL) ×1 IMPLANT
ELECTRODE REM PT RTRN 9FT ADLT (ELECTROSURGICAL) ×1 IMPLANT
FORCEPS BPLR FENES DVNC XI (FORCEP) ×1 IMPLANT
GLOVE BIOGEL PI IND STRL 7.0 (GLOVE) ×3 IMPLANT
GLOVE SURG SYN 6.5 ES PF (GLOVE) ×3 IMPLANT
GLOVE SURG SYN 6.5 PF PI (GLOVE) ×3 IMPLANT
GOWN STRL REUS W/ TWL LRG LVL3 (GOWN DISPOSABLE) ×6 IMPLANT
GRASPER SUT TROCAR 14GX15 (MISCELLANEOUS) IMPLANT
GRASPER TIP-UP FEN DVNC XI (INSTRUMENTS) ×1 IMPLANT
HANDLE YANKAUER SUCT BULB TIP (MISCELLANEOUS) ×1 IMPLANT
IRRIGATION STRYKERFLOW (MISCELLANEOUS) IMPLANT
IRRIGATOR STRYKERFLOW (MISCELLANEOUS) IMPLANT
IRRIGATOR SUCT 8 DISP DVNC XI (IRRIGATION / IRRIGATOR) IMPLANT
IV NS 1000ML BAXH (IV SOLUTION) IMPLANT
KIT PINK PAD W/HEAD ARE REST (MISCELLANEOUS) ×1 IMPLANT
KIT PINK PAD W/HEAD ARM REST (MISCELLANEOUS) ×1 IMPLANT
KIT TURNOVER CYSTO (KITS) ×1 IMPLANT
LABEL OR SOLS (LABEL) IMPLANT
MANIFOLD NEPTUNE II (INSTRUMENTS) ×1 IMPLANT
NDL DRIVE SUT CUT DVNC (INSTRUMENTS) ×1 IMPLANT
NDL HYPO 22X1.5 SAFETY MO (MISCELLANEOUS) ×1 IMPLANT
NDL INSUFFLATION 14GA 120MM (NEEDLE) ×1 IMPLANT
NEEDLE DRIVE SUT CUT DVNC (INSTRUMENTS) ×1 IMPLANT
NEEDLE HYPO 22X1.5 SAFETY MO (MISCELLANEOUS) ×1 IMPLANT
NEEDLE INSUFFLATION 14GA 120MM (NEEDLE) ×1 IMPLANT
OBTURATOR OPTICAL STND 8 DVNC (TROCAR) ×1 IMPLANT
OBTURATOR OPTICALSTD 8 DVNC (TROCAR) ×1 IMPLANT
PACK COLON CLEAN CLOSURE (MISCELLANEOUS) ×1 IMPLANT
PACK LAP CHOLECYSTECTOMY (MISCELLANEOUS) ×1 IMPLANT
PORT ACCESS TROCAR AIRSEAL 5 (TROCAR) ×1 IMPLANT
RELOAD STAPLE 60 3.5 BLU DVNC (STAPLE) IMPLANT
RETRACTOR RING XSMALL (MISCELLANEOUS) IMPLANT
RETRACTOR WOUND ALXS 18CM MED (MISCELLANEOUS) IMPLANT
RTRCTR WOUND ALEXIS 13CM XS SH (MISCELLANEOUS) ×1 IMPLANT
RTRCTR WOUND ALEXIS O 18CM MED (MISCELLANEOUS) ×1 IMPLANT
SCISSORS LAP 5X35 DISP (ENDOMECHANICALS) IMPLANT
SCISSORS MNPLR CVD DVNC XI (INSTRUMENTS) ×1 IMPLANT
SEAL UNIV 5-12 XI (MISCELLANEOUS) ×4 IMPLANT
SEALER VESSEL EXT DVNC XI (MISCELLANEOUS) IMPLANT
SET TRI-LUMEN FLTR TB AIRSEAL (TUBING) ×1 IMPLANT
SOL ELECTROSURG ANTI STICK (MISCELLANEOUS) ×1 IMPLANT
SOL PREP PVP 2OZ (MISCELLANEOUS) ×1 IMPLANT
SOLUTION ELECTROSURG ANTI STCK (MISCELLANEOUS) ×1 IMPLANT
SOLUTION PREP PVP 2OZ (MISCELLANEOUS) ×1 IMPLANT
SPONGE T-LAP 18X18 ~~LOC~~+RFID (SPONGE) ×1 IMPLANT
STAPLER 60 SUREFORM DVNC (STAPLE) IMPLANT
STAPLER CIRCULAR MANUAL XL 29 (STAPLE) IMPLANT
STAPLER RELOAD 3.5X60 BLU DVNC (STAPLE) IMPLANT
STAPLER RELOADABLE 65 2-0 SUT (MISCELLANEOUS) IMPLANT
STAPLER SKIN PROX 35W (STAPLE) IMPLANT
STAPLER SYS INTERNAL RELOAD SS (MISCELLANEOUS) ×1 IMPLANT
SURGILUBE 2OZ TUBE FLIPTOP (MISCELLANEOUS) ×1 IMPLANT
SUT DVC VLOC 90 3-0 CV23 VLT (SUTURE) ×1 IMPLANT
SUT MNCRL AB 4-0 PS2 18 (SUTURE) ×2 IMPLANT
SUT PDS AB 1 CT1 36 (SUTURE) ×2 IMPLANT
SUT SILK 3 0 SH 30 (SUTURE) IMPLANT
SUT STRATA 3-0 15 RB-1 (SUTURE) IMPLANT
SUT VIC AB 3-0 SH 27X BRD (SUTURE) IMPLANT
SUTURE DVC VLC 90 3-0 CV23 VLT (SUTURE) IMPLANT
SYR 30ML LL (SYRINGE) ×2 IMPLANT
SYS LAPSCP GELPORT 120MM (MISCELLANEOUS) IMPLANT
SYS TROCAR 1.5-3 SLV ABD GEL (ENDOMECHANICALS) ×1 IMPLANT
SYSTEM LAPSCP GELPORT 120MM (MISCELLANEOUS) IMPLANT
SYSTEM TROCR 1.5-3 SLV ABD GEL (ENDOMECHANICALS) ×1 IMPLANT
SYSTEM WECK SHIELD CLOSURE (TROCAR) IMPLANT
TRAP FLUID SMOKE EVACUATOR (MISCELLANEOUS) ×1 IMPLANT
TRAY FOLEY MTR SLVR 16FR STAT (SET/KITS/TRAYS/PACK) ×1 IMPLANT
TROCAR Z-THREAD FIOS 5X100MM (TROCAR) IMPLANT
WATER STERILE IRR 500ML POUR (IV SOLUTION) ×1 IMPLANT

## 2023-07-12 NOTE — Telephone Encounter (Signed)
 Copied from CRM [id number]. Topic: General - Other >> Jul 12, 2023  3:24 PM Dondra Prader A wrote: Reason for CRM: Liborio Nixon pt wife called to let pt PCP know that he made it through his surgery.

## 2023-07-12 NOTE — Anesthesia Procedure Notes (Signed)
Procedure Name: Intubation Date/Time: 07/12/2023 7:40 AM  Performed by: Rich Brave, CRNAPre-anesthesia Checklist: Patient identified, Emergency Drugs available, Suction available, Patient being monitored and Timeout performed Patient Re-evaluated:Patient Re-evaluated prior to induction Oxygen Delivery Method: Circle system utilized Preoxygenation: Pre-oxygenation with 100% oxygen Induction Type: IV induction Ventilation: Mask ventilation without difficulty Laryngoscope Size: Mac and 4 Grade View: Grade I Tube type: Oral Tube size: 7.0 mm Number of attempts: 1 Airway Equipment and Method: Stylet and Video-laryngoscopy Placement Confirmation: ETT inserted through vocal cords under direct vision, positive ETCO2 and breath sounds checked- equal and bilateral Secured at: 22 cm Tube secured with: Tape Dental Injury: Teeth and Oropharynx as per pre-operative assessment

## 2023-07-12 NOTE — Transfer of Care (Signed)
Immediate Ansthesia Transfer of Care Note  Patient: Walter Tyler  Procedure(s) Performed: XI ROBOTIC ASSISTED COLOSTOMY TAKEDOWN  Patient Location: PACU  Anesthesia Type:General  Level of Consciousness: drowsy  Airway & Oxygen Therapy: Patient Spontanous Breathing and Patient connected to face mask oxygen  Post-op Assessment: Report given to RN and Post -op Vital signs reviewed and stable  Post vital signs: Reviewed and stable  Last Vitals:  Vitals Value Taken Time  BP 127/83 07/12/23 1337  Temp 96.8   Pulse 75 07/12/23 1342  Resp 16 07/12/23 1342  SpO2 100 % 07/12/23 1342  Vitals shown include unfiled device data.  Last Pain:  Vitals:   07/12/23 0636  TempSrc: Oral  PainSc: 0-No pain        Complications: No notable events documented.

## 2023-07-12 NOTE — Interval H&P Note (Signed)
OK to proceed. No change.

## 2023-07-12 NOTE — Anesthesia Preprocedure Evaluation (Signed)
Anesthesia Evaluation  Patient identified by MRN, date of birth, ID band Patient awake    Reviewed: Allergy & Precautions, NPO status , Patient's Chart, lab work & pertinent test results  History of Anesthesia Complications Negative for: history of anesthetic complications  Airway Mallampati: III  TM Distance: >3 FB Neck ROM: Full    Dental no notable dental hx. (+) Teeth Intact   Pulmonary neg pulmonary ROS, neg sleep apnea, neg COPD, Patient abstained from smoking.Not current smoker   Pulmonary exam normal breath sounds clear to auscultation       Cardiovascular Exercise Tolerance: Good METS(-) hypertension(-) CAD and (-) Past MI negative cardio ROS (-) dysrhythmias  Rhythm:Regular Rate:Bradycardia - Systolic murmurs    Neuro/Psych negative neurological ROS  negative psych ROS   GI/Hepatic ,neg GERD  ,,(+)     (-) substance abuse    Endo/Other  neg diabetes    Renal/GU negative Renal ROS     Musculoskeletal   Abdominal   Peds  Hematology   Anesthesia Other Findings Past Medical History: No date: BPH (benign prostatic hyperplasia) No date: Colostomy in place [facility]) No date: Diverticulitis large intestine No date: Perforation of sigmoid colon (HCC) No date: Shoulder pain, acute  Reproductive/Obstetrics                             Anesthesia Physical Anesthesia Plan  ASA: 1  Anesthesia Plan: General   Post-op Pain Management: Minimal or no pain anticipated   Induction: Intravenous  PONV Risk Score and Plan: 3 and Ondansetron, Dexamethasone and Midazolam  Airway Management Planned: Oral ETT  Additional Equipment: None  Intra-op Plan:   Post-operative Plan: Extubation in OR  Informed Consent: I have reviewed the patients History and Physical, chart, labs and discussed the procedure including the risks, benefits and alternatives for the proposed anesthesia with the patient  or authorized representative who has indicated his/her understanding and acceptance.     Dental advisory given  Plan Discussed with: CRNA and Surgeon  Anesthesia Plan Comments: (Discussed risks of anesthesia with patient, including possibility of difficulty with spontaneous ventilation under anesthesia necessitating airway intervention, PONV, and rare risks such as cardiac or respiratory or neurological events, and allergic reactions. Discussed the role of CRNA in patient's perioperative care. Patient understands.)       Anesthesia Quick Evaluation

## 2023-07-13 ENCOUNTER — Encounter: Payer: Self-pay | Admitting: Surgery

## 2023-07-13 LAB — BASIC METABOLIC PANEL
Anion gap: 8 (ref 5–15)
BUN: 14 mg/dL (ref 8–23)
CO2: 24 mmol/L (ref 22–32)
Calcium: 8.9 mg/dL (ref 8.9–10.3)
Chloride: 105 mmol/L (ref 98–111)
Creatinine, Ser: 1.03 mg/dL (ref 0.61–1.24)
GFR, Estimated: >60 mL/min (ref >60)
Glucose, Bld: 100 mg/dL — ABNORMAL HIGH (ref 70–99)
Potassium: 4.1 mmol/L (ref 3.5–5.1)
Sodium: 137 mmol/L (ref 135–145)

## 2023-07-13 LAB — CBC
HCT: 36.5 % — ABNORMAL LOW (ref 39.0–52.0)
Hemoglobin: 12.8 g/dL — ABNORMAL LOW (ref 13.0–17.0)
MCH: 30.5 pg (ref 26.0–34.0)
MCHC: 35.1 g/dL (ref 30.0–36.0)
MCV: 86.9 fL (ref 80.0–100.0)
Platelets: 162 10*3/uL (ref 150–400)
RBC: 4.2 MIL/uL — ABNORMAL LOW (ref 4.22–5.81)
RDW: 13.2 % (ref 11.5–15.5)
WBC: 9.7 10*3/uL (ref 4.0–10.5)
nRBC: 0 % (ref 0.0–0.2)

## 2023-07-13 MED ORDER — ORAL CARE MOUTH RINSE: 15.0000 mL | OROMUCOSAL | Status: DC | PRN

## 2023-07-13 MED ORDER — GABAPENTIN 100 MG PO CAPS
ORAL_CAPSULE | ORAL | Status: AC
Start: 2023-07-13 — End: ?
  Filled 2023-07-13: qty 3

## 2023-07-13 NOTE — Plan of Care (Signed)
  Problem: Pain Managment: Goal: General experience of comfort will improve and/or be controlled Outcome: Progressing   Problem: Skin Integrity: Goal: Demonstration of wound healing without infection will improve Outcome: Progressing

## 2023-07-13 NOTE — Op Note (Addendum)
Preoperative diagnosis: End colostomy status.  Postoperative diagnosis: End Colostomy Status.   Procedure: Robotic assisted laparoscopic,  colostomy reversal  Anesthesia: GETA  Surgeon: Tonna Boehringer Assistant: Gaston Islam, RNFA  Wound Classification: Clean Contaminated  Specimen: None  Complications: Initial staple line anastomosis leak noted, reinforced and repaired as noted below  EBL: 40 mL  Indications:  Patient is a 68 y.o. male that underwent diverting end colostomy and Hartman's pouch for diverticulitis. Patient presents today for take down of his Hartmann's pouch.   Description of procedure: The patient was taken to the operating room and placed in the supine position. General endotracheal anesthesia was induced without any difficulty. A time-out was completed verifying correct patient, procedure, site, positioning, and implant(s) and/or special equipment prior to beginning this procedure. Both upper extremities were tucked.  A Foley catheter and an orogastric tube were placed. Preoperative antibiotics were given. The patient was re-positioned in the dorsal lithotomy position with stirrups and care was taken to pad all pressure points. The colostomy was covered with a Tegaderm to prevent fecal contamination of the surgical field. The patient's abdomen and perineum were then prepped and draped in the usual sterile fashion, with ioband over field.  Veress needle placed at Palmer's point and after confirming 2 clicks and a positive saline drop test, insufflation started.  No immediate rise in pressure noted in pressure of 15 mmHg eventually achieved.  Veress needle removed and 5 mm port placed in the same location via Optiview technique.  Inspection of the area did not note any obvious injury at the time of placement.  Under direct visualization, four 8 mm robotic ports were placed along the right abdomen and the fourth 1 replacing the initially Optiview port all under direct  visualization.  Exparel infused as a tap block.  Robot brought into field and docked.  the abdomen was explored.  Reexamination of the Optiview port site noted small bowel adhesion adjacent to the area of insertion.  Questionable serosal tear was noted at this adjacent bowel so 3-0 silk was used to reinforce the area in a Lembert fashion.    Extensive adhesions along the previous midline scar then lysed sharply under direct vision with scissors.  Lysis of adhesions approximately till The entire procedure time.  Once the abdominal wall was cleared of any obvious adhesions, Patient placed in reverse Trendelenburg and the small bowel attachments within the pelvis was removed using scissors.  Once the dissection was completed, rectal stump identified from the previously placed PDS suture.  Additional dissection carried around the area to clear the peritoneum to allow mobilization of the rectal stump.    The distal colon was mobilized and freed from any adjacent adhesions up to the ostomy site in the anterior abdominal wall. This created slack in the transverse and distal colon in preparation for the eventual end-to-end anastomosis.    Attention was turned toward taking down the colostomy.  XR robot undocked and Tegaderm over ostomy site removed and area prepped.  An elliptical incision encompassing the colostomy was made using the electrocautery and deepened through the subcutaneous and surrounding fascia. Once the proximal colon was freed from the fascia and reducible into the abdomen, a pursestring device was used to transect the end, and the anvil portion of a 29 mm EEA stapler was secured in place.  The anastomotic edges of the anvil was then cleared.  Small serosal tear noted distal to the anvil placement site was reinforced with 3-0 silk in a Lembert fashion.  The anvil and colon was then reduced completely back into the abdominal cavity, wound protector placed in the former ostomy site and tied down with  a lap sponge so the abdomen can be insufflated again.  After insufflation resumed the robot was docked into place in preparation for the anastomosis creation.  Anvil and  easily reach down adjacent to the rectal stump.  Firefly used to confirm adequate circulation to the area.  The EEA stapler was advanced carefully by an assistant from below.  The antimesenteric boarders were aligned and proximal colon confirmed to not be twisted.  The EEA stapler was attached to the anvil and fired. The stapler was removed and the rings of tissue retained in the stapler were inspected and found to be complete with intact rings. The anastomosis was tested for patency and integrity by filling the abdomen with saline and insufflating the rectum with air while occluding the colon proximal to the anastomosis.  Small bubbles noted from right lateral aspect of the anastomosis.  Fluid was removed and 3 oh V-Loc was used to reinforce the entire staple line on the right side in a Lembert fashion.  Air leak test repeated and no additional bubbles noted after the reinforcement.  Vistaseal applied over the staple line.  The abdomen was copiously irrigated with normal saline.  Hemostasis was checked, prior to undocking the robot.   Clean closure protocol initiated.  The ostomy site fascia was close with a running suture of 1 PDS x 2.  Skin approximated with staples at all port sites as well as the ostomy site with some room in between staples at the ostomy site to pack iodine infused Telfa gauze.  All sites then dressed with honeycomb dressing.  Patient then transferred to PACU after successful intubation.  Sponge count and instrument correct at end the procedure.  Will continue with antibiotics on the floor due to the anastomosis leak noted

## 2023-07-13 NOTE — Progress Notes (Signed)
Subjective:  CC: Walter Tyler is a 68 y.o. male  Hospital stay day 1, 1 Day Post-Op robotic assisted end colostomy reversal for history of diverticulitis  HPI: No issues overnight.  0 pain.  Already passing flatus.  ROS:  General: Denies weight loss, weight gain, fatigue, fevers, chills, and night sweats. Heart: Denies chest pain, palpitations, racing heart, irregular heartbeat, leg pain or swelling, and decreased activity tolerance. Respiratory: Denies breathing difficulty, shortness of breath, wheezing, cough, and sputum. GI: Denies change in appetite, heartburn, nausea, vomiting, constipation, diarrhea, and blood in stool. GU: Denies difficulty urinating, pain with urinating, urgency, frequency, blood in urine.   Objective:   Temp:  [96.8 F (36 C)-99 F (37.2 C)] 98.2 F (36.8 C) (02/19 0758) Pulse Rate:  [48-81] 61 (02/19 0758) Resp:  [10-18] 16 (02/19 0758) BP: (123-145)/(72-90) 127/75 (02/19 0758) SpO2:  [98 %-100 %] 98 % (02/19 0758)     Height: 5\' 10"  (177.8 cm) Weight: 71.2 kg BMI (Calculated): 22.53   Intake/Output this shift:   Intake/Output Summary (Last 24 hours) at 07/13/2023 1308 Last data filed at 07/13/2023 0758 Gross per 24 hour  Intake 2744.91 ml  Output 1255 ml  Net 1489.91 ml    Constitutional :  alert, cooperative, appears stated age, and no distress  Respiratory:  clear to auscultation bilaterally  Cardiovascular:  regular rate and rhythm  Gastrointestinal: soft, non-tender; bowel sounds normal; no masses,  no organomegaly.   Skin: Cool and moist.  Staple line dressings intact.  Psychiatric: Normal affect, non-agitated, not confused       LABS:     Latest Ref Rng & Units 07/13/2023    6:15 AM 07/12/2023    3:58 PM 05/19/2023   12:01 PM  CMP  Glucose 70 - 99 mg/dL 657   87   BUN 8 - 23 mg/dL 14   10   Creatinine 8.46 - 1.24 mg/dL 9.62  9.52  8.41   Sodium 135 - 145 mmol/L 137   142   Potassium 3.5 - 5.1 mmol/L 4.1   3.9   Chloride 98 - 111  mmol/L 105   105   CO2 22 - 32 mmol/L 24   29   Calcium 8.9 - 10.3 mg/dL 8.9   9.4   Total Protein 6.1 - 8.1 g/dL   7.0   Total Bilirubin 0.2 - 1.2 mg/dL   0.6   AST 10 - 35 U/L   24   ALT 9 - 46 U/L   18       Latest Ref Rng & Units 07/13/2023    6:15 AM 07/12/2023    3:58 PM 05/19/2023   12:01 PM  CBC  WBC 4.0 - 10.5 K/uL 9.7  7.3  4.3   Hemoglobin 13.0 - 17.0 g/dL 32.4  40.1  02.7   Hematocrit 39.0 - 52.0 % 36.5  40.2  40.2   Platelets 150 - 400 K/uL 162  164  189     RADS: N/a Assessment:   Status post robotic assisted laparoscopic colostomy takedown for history of diverticulitis  Doing really well.  Discontinue Foley.  Advance to clear liquid diet.  Will continue antibiotics due to the initial anastomosis leak noted IntraOp.  labs/images/medications/previous chart entries reviewed personally and relevant changes/updates noted above.

## 2023-07-13 NOTE — Plan of Care (Signed)
  Problem: Clinical Measurements: Goal: Respiratory complications will improve Outcome: Progressing   Problem: Coping: Goal: Level of anxiety will decrease Outcome: Progressing   Problem: Pain Managment: Goal: General experience of comfort will improve and/or be controlled Outcome: Progressing

## 2023-07-13 NOTE — Anesthesia Postprocedure Evaluation (Signed)
Anesthesia Post Note  Patient: [name]  Procedure(s) Performed: XI ROBOTIC ASSISTED COLOSTOMY TAKEDOWN  Patient location during evaluation: PACU Anesthesia Type: General Level of consciousness: awake and alert Pain management: pain level controlled Vital Signs Assessment: post-procedure vital signs reviewed and stable Respiratory status: spontaneous breathing, nonlabored ventilation and respiratory function stable Cardiovascular status: blood pressure returned to baseline and stable Postop Assessment: no apparent nausea or vomiting Anesthetic complications: no   No notable events documented.   Last Vitals:  Vitals:   07/12/23 2117 07/13/23 0409  BP: 123/80 132/72  Pulse: 62 61  Resp: 15 16  Temp: 37.1 C 37.2 C  SpO2: 99% 100%    Last Pain:  Vitals:   07/13/23 0409  TempSrc: Temporal  PainSc: 0-No pain                 [name]

## 2023-07-14 LAB — CBC
HCT: 34.4 % — ABNORMAL LOW (ref 39.0–52.0)
Hemoglobin: 11.9 g/dL — ABNORMAL LOW (ref 13.0–17.0)
MCH: 30.4 pg (ref 26.0–34.0)
MCHC: 34.6 g/dL (ref 30.0–36.0)
MCV: 87.8 fL (ref 80.0–100.0)
Platelets: 147 10*3/uL — ABNORMAL LOW (ref 150–400)
RBC: 3.92 MIL/uL — ABNORMAL LOW (ref 4.22–5.81)
RDW: 13.2 % (ref 11.5–15.5)
WBC: 6.7 10*3/uL (ref 4.0–10.5)
nRBC: 0 % (ref 0.0–0.2)

## 2023-07-14 LAB — BASIC METABOLIC PANEL
Anion gap: 8 (ref 5–15)
BUN: 11 mg/dL (ref 8–23)
CO2: 27 mmol/L (ref 22–32)
Calcium: 8.7 mg/dL — ABNORMAL LOW (ref 8.9–10.3)
Chloride: 104 mmol/L (ref 98–111)
Creatinine, Ser: 0.92 mg/dL (ref 0.61–1.24)
GFR, Estimated: >60 mL/min (ref >60)
Glucose, Bld: 91 mg/dL (ref 70–99)
Potassium: 4.3 mmol/L (ref 3.5–5.1)
Sodium: 139 mmol/L (ref 135–145)

## 2023-07-14 MED ORDER — CELECOXIB 200 MG PO CAPS
ORAL_CAPSULE | ORAL | Status: AC
Start: 2023-07-14 — End: ?
  Filled 2023-07-14: qty 1

## 2023-07-14 MED ORDER — PANTOPRAZOLE SODIUM 40 MG IV SOLR
INTRAVENOUS | Status: AC
  Filled 2023-07-14: qty 10

## 2023-07-14 MED ORDER — GABAPENTIN 300 MG PO CAPS
ORAL_CAPSULE | ORAL | Status: AC
  Filled 2023-07-14: qty 1

## 2023-07-14 NOTE — Plan of Care (Signed)
  Problem: Activity: Goal: Risk for activity intolerance will decrease Outcome: Progressing   Problem: Pain Managment: Goal: General experience of comfort will improve and/or be controlled Outcome: Progressing

## 2023-07-14 NOTE — Plan of Care (Signed)
  Problem: Activity: Goal: Risk for activity intolerance will decrease Outcome: Progressing   Problem: Elimination: Goal: Will not experience complications related to urinary retention Outcome: Progressing   

## 2023-07-15 LAB — CBC
HCT: 33.1 % — ABNORMAL LOW (ref 39.0–52.0)
Hemoglobin: 11.5 g/dL — ABNORMAL LOW (ref 13.0–17.0)
MCH: 30.2 pg (ref 26.0–34.0)
MCHC: 34.7 g/dL (ref 30.0–36.0)
MCV: 86.9 fL (ref 80.0–100.0)
Platelets: 156 10*3/uL (ref 150–400)
RBC: 3.81 MIL/uL — ABNORMAL LOW (ref 4.22–5.81)
RDW: 13 % (ref 11.5–15.5)
WBC: 6 10*3/uL (ref 4.0–10.5)
nRBC: 0 % (ref 0.0–0.2)

## 2023-07-15 MED ORDER — IBUPROFEN 800 MG PO TABS: 800.0000 mg | ORAL_TABLET | Freq: Three times a day (TID) | ORAL | 0 refills | Status: AC | PRN

## 2023-07-15 MED ORDER — ACETAMINOPHEN 325 MG PO TABS: 650.0000 mg | ORAL_TABLET | Freq: Three times a day (TID) | ORAL | 0 refills | Status: AC | PRN

## 2023-07-15 NOTE — Discharge Instructions (Signed)
Ostomy Reversal, Care After This sheet gives you information about how to care for yourself after your procedure. Your health care provider may also give you more specific instructions. If you have problems or questions, contact your health care provider. What can I expect after the procedure? After your procedure, it is common to have the following: Pain in your abdomen, especially in the incision areas. You will be given medicine to control the pain. Tiredness. This is a normal part of the recovery process. Your energy level will return to normal over the next several weeks. Changes in your bowel movements, such as constipation or needing to go more often. Talk with your health care provider about how to manage this. Follow these instructions at home: Medicines  tylenol and advil as needed for discomfort.  Please alternate between the two every four hours as needed for pain.    Use narcotics, if prescribed, only when tylenol and motrin is not enough to control pain.  325-650mg  every 8hrs to max of 4000mg /24hrs (including the 325mg  in every norco dose) for the tylenol.    Advil up to 800mg  per dose every 8hrs as needed for pain.   Do not drive or use heavy machinery while taking prescription pain medicine. Do not drink alcohol while taking prescription pain medicine. If you were prescribed an antibiotic medicine, use it as told by your health care provider. Do not stop using the antibiotic even if you start to feel better. Incision care    Follow instructions from your health care provider about how to take care of your incision areas. Make sure you: Keep your incisions clean and dry. REMOVE DRESSINGS IN 24HRS, THEN OK TO SHOWER, AFTERWARDS, KEEP FORMER OSTOMY SITE COVERED WITH GAUZE AND TAPE AND CHANGE DAILY UNTIL HEALED.  REMAINING SITES DO NOT HAVE TO BE COVERED Check your incision area every day for signs of infection. Check for: More redness, swelling, or pain. More fluid or  blood. Warmth. Pus or a bad smell. Activity Avoid lifting anything that is heavier than 10 lb (4.5 kg) for 2 weeks or until your health care provider says it is okay. You may resume normal activities as told by your health care provider. Ask your health care provider what activities are safe for you. Take rest breaks during the day as needed. Eating and drinking Follow instructions from your health care provider about what you can eat after surgery. To prevent or treat constipation while you are taking prescription pain medicine, your health care provider may recommend that you: Drink enough fluid to keep your urine clear or pale yellow. Take over-the-counter or prescription medicines. Eat foods that are high in fiber, such as fresh fruits and vegetables, whole grains, and beans. Limit foods that are high in fat and processed sugars, such as fried and sweet foods. General instructions Ask your health care provider when you will need an appointment to get your sutures or staples removed. Keep all follow-up visits as told by your health care provider. This is important. Contact a health care provider if: You have more redness, swelling, or pain around your incisions. You have more fluid or blood coming from the incisions. Your incisions feel warm to the touch. You have pus or a bad smell coming from your incisions or your dressing. You have a fever. You have an incision that breaks open (edges not staying together) after sutures or staples have been removed. Get help right away if: You develop a rash. You have chest pain  or difficulty breathing. You have pain or swelling in your legs. You feel light-headed or you faint. Your abdomen swells (becomes distended). You have nausea or vomiting. You have blood in your stool (feces). This information is not intended to replace advice given to you by your health care provider. Make sure you discuss any questions you have with your health care  provider. Document Released: 11/27/2004 Document Revised: 01/27/2018 Document Reviewed: 02/09/2016 Elsevier Interactive Patient Education  2019 [employer].

## 2023-07-15 NOTE — Discharge Summary (Signed)
Physician Discharge Summary  Patient ID: Walter Tyler MRN: [id number] DOB/AGE: 1955-12-06 68 y.o.  Admit date: 07/12/2023 Discharge date: 07/15/2023  Admission Diagnoses: Ostomy in place  Discharge Diagnoses:  Same as above  Discharged Condition: good  Hospital Course: admitted for above. Underwent surgery.  Please see op note for details.  Post op, recovered as expected.  At time of d/c, tolerating diet and pain controlled.  Packing in between the staples at the former ostomy site was removed and the area redressed with 4 x 4 and gauze.  Instructed patient to keep the honeycomb dressings on at the port sites for an additional day and then change out all dressings tomorrow, including the new gauze placed today over the ostomy site.  Consults: None  Discharge Exam: Blood pressure 119/76, pulse 64, temperature 98.4 F (36.9 C), temperature source Oral, resp. rate 17, height 5\' 10"  (1.778 m), weight 71.2 kg, SpO2 98%. General appearance: alert, cooperative, and no distress GI: soft, non-tender; bowel sounds normal; no masses,  no organomegaly and staples are clean dry and intact.  Former ostomy site wound clean with no obvious discharge.  Disposition:  Discharge disposition: 01-Home or Self Care        Allergies as of 07/15/2023   No Known Allergies      Medication List     TAKE these medications    acetaminophen 325 MG tablet Commonly known as: Tylenol Take 2 tablets (650 mg total) by mouth every 8 (eight) hours as needed for mild pain (pain score 1-3).   ibuprofen 800 MG tablet Commonly known as: ADVIL Take 1 tablet (800 mg total) by mouth every 8 (eight) hours as needed for mild pain (pain score 1-3) or moderate pain (pain score 4-6).   MULTIVITAMIN ADULTS 50+ PO Take by mouth.        Follow-up Information     Tonna Boehringer, [place], DO Follow up in 2 week(s).   Specialties: General Surgery, Surgery Why: post op staple removal 07/26/23 8:45 am Contact  information: [address] [facility] [place] [address] [phone]                  Total time spent arranging discharge was >66min. Signed: Sung Amabile 07/15/2023, 2:42 PM

## 2023-07-15 NOTE — Plan of Care (Signed)
   Problem: Activity: Goal: Risk for activity intolerance will decrease Outcome: Progressing   Problem: Nutrition: Goal: Adequate nutrition will be maintained Outcome: Progressing   Problem: Elimination: Goal: Will not experience complications related to urinary retention Outcome: Progressing   Problem: Pain Managment: Goal: General experience of comfort will improve and/or be controlled Outcome: Progressing

## 2023-07-15 NOTE — Plan of Care (Signed)
   Problem: Elimination: Goal: Will not experience complications related to bowel motility Outcome: Progressing

## 2023-07-22 NOTE — Progress Notes (Signed)
 Subjective:  CC: Walter Tyler is a 68 y.o. male  Hospital stay day 2, 2 Days Post-Op robotic assisted end colostomy reversal for history of diverticulitis  HPI: No issues overnight.  Still 0 pain.  Already passing flatus and now had BMs  ROS:  General: Denies weight loss, weight gain, fatigue, fevers, chills, and night sweats. Heart: Denies chest pain, palpitations, racing heart, irregular heartbeat, leg pain or swelling, and decreased activity tolerance. Respiratory: Denies breathing difficulty, shortness of breath, wheezing, cough, and sputum. GI: Denies change in appetite, heartburn, nausea, vomiting, constipation, diarrhea, and blood in stool. GU: Denies difficulty urinating, pain with urinating, urgency, frequency, blood in urine.   Objective:         Height: 5\' 10"  (177.8 cm) Weight: 71.2 kg BMI (Calculated): 22.53    Constitutional :  alert, cooperative, appears stated age, and no distress  Respiratory:  clear to auscultation bilaterally  Cardiovascular:  regular rate and rhythm  Gastrointestinal: soft, non-tender; bowel sounds normal; no masses,  no organomegaly.   Skin: Cool and moist.  Staple line dressings intact.  Psychiatric: Normal affect, non-agitated, not confused       LABS:  stable  RADS: N/a Assessment:   Status post robotic assisted laparoscopic colostomy takedown for history of diverticulitis  Doing really well.  Advanced to full liquid  Will continue antibiotics due to the initial anastomosis leak noted IntraOp.  labs/images/medications/previous chart entries reviewed personally and relevant changes/updates noted above.

## 2024-05-21 ENCOUNTER — Ambulatory Visit: Payer: Self-pay | Admitting: Family Medicine
